# Patient Record
Sex: Female | Born: 1990 | Race: White | Hispanic: No | Marital: Single | Smoking: Never smoker
Health system: Southern US, Community
[De-identification: ages and names within clinical notes are randomized; demographics above are authoritative.]

## PROBLEM LIST (undated history)

## (undated) HISTORY — PX: BUNIONECTOMY: SHX129

---
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
## 2013-03-13 ENCOUNTER — Ambulatory Visit: Payer: Self-pay | Admitting: Family Medicine

## 2014-09-09 IMAGING — CR DG CHEST 2V
1 series · 3 of 3 positions shown · non-contrast
Comparison: none

REASON FOR EXAM: cough
COMMENTS:

PROCEDURE:     KDR - KDXR CHEST PA (OR AP) AND LAT  - March 13, 2013  [DATE]
RESULT:     The lungs are clear. The heart and pulmonary vessels are normal.
The bony and mediastinal structures are unremarkable. There is no effusion.
There is no pneumothorax or evidence of congestive failure.

[Series 1: pa · 0.17mm/px · 3 of 3 slices shown]
[im 1/3]
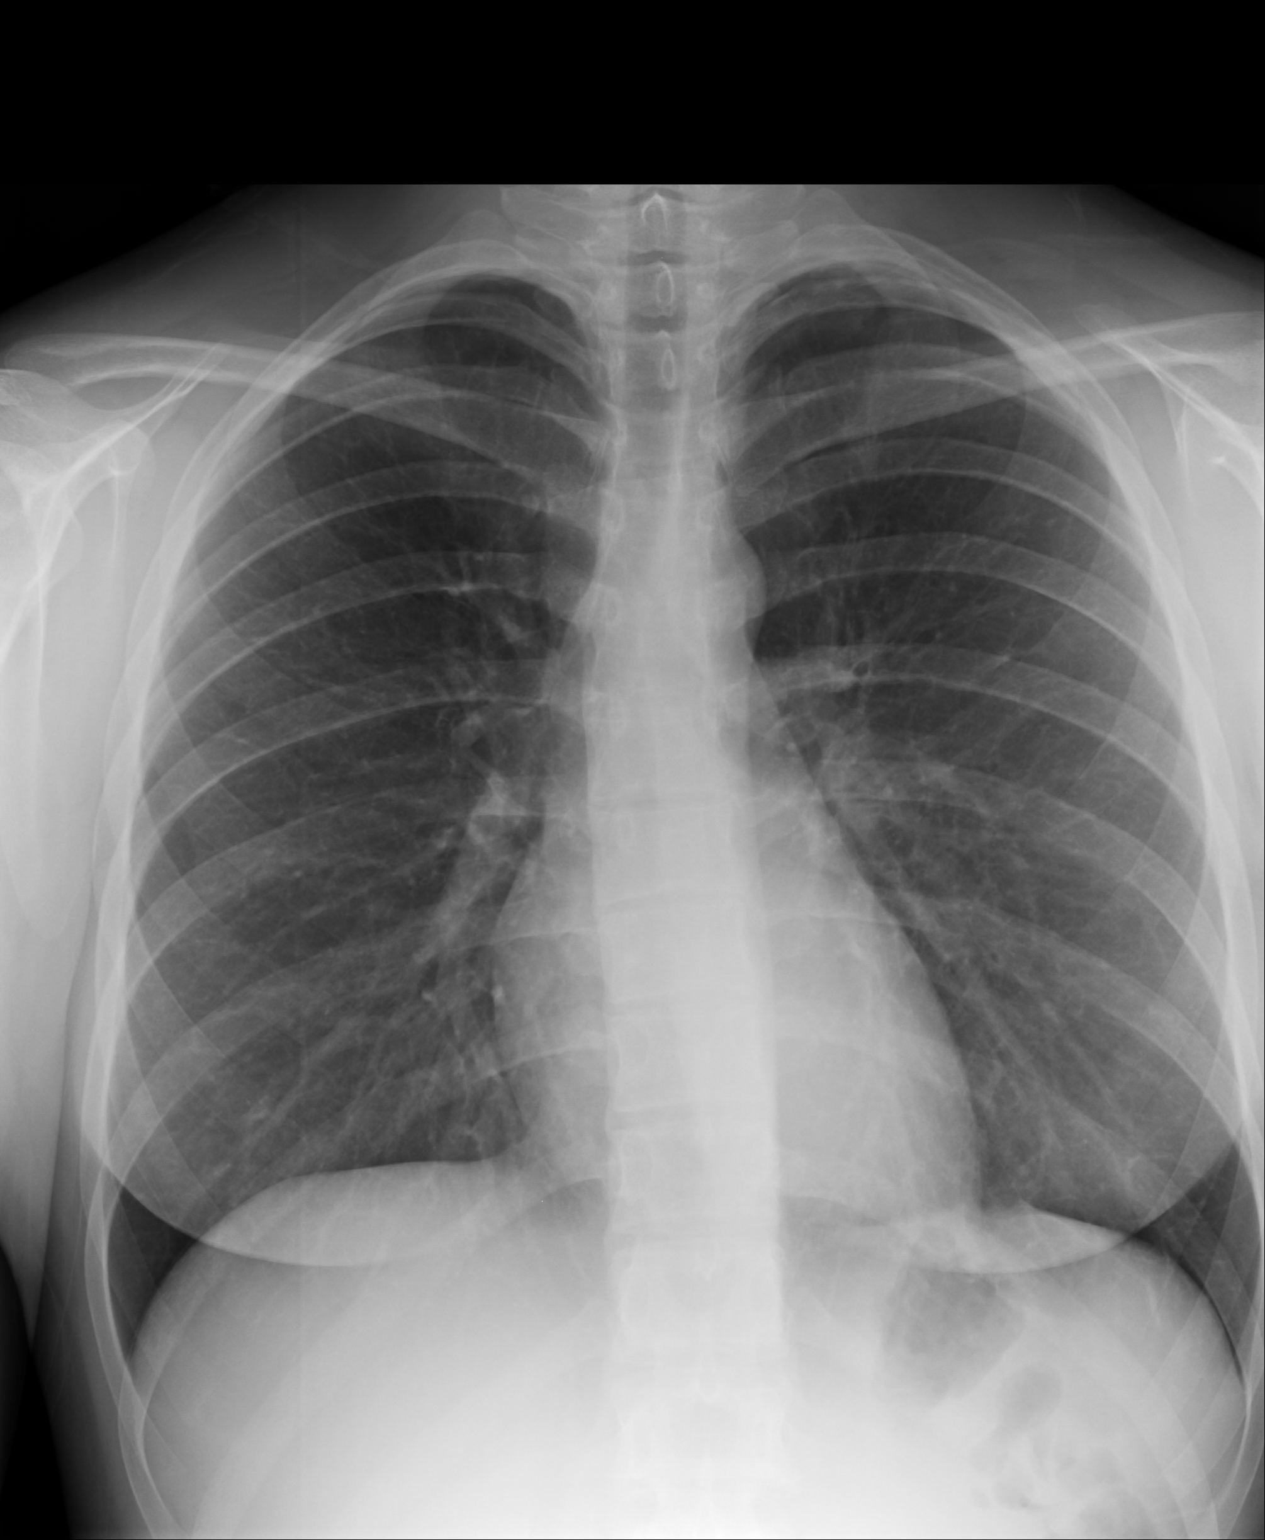
[im 2/3]
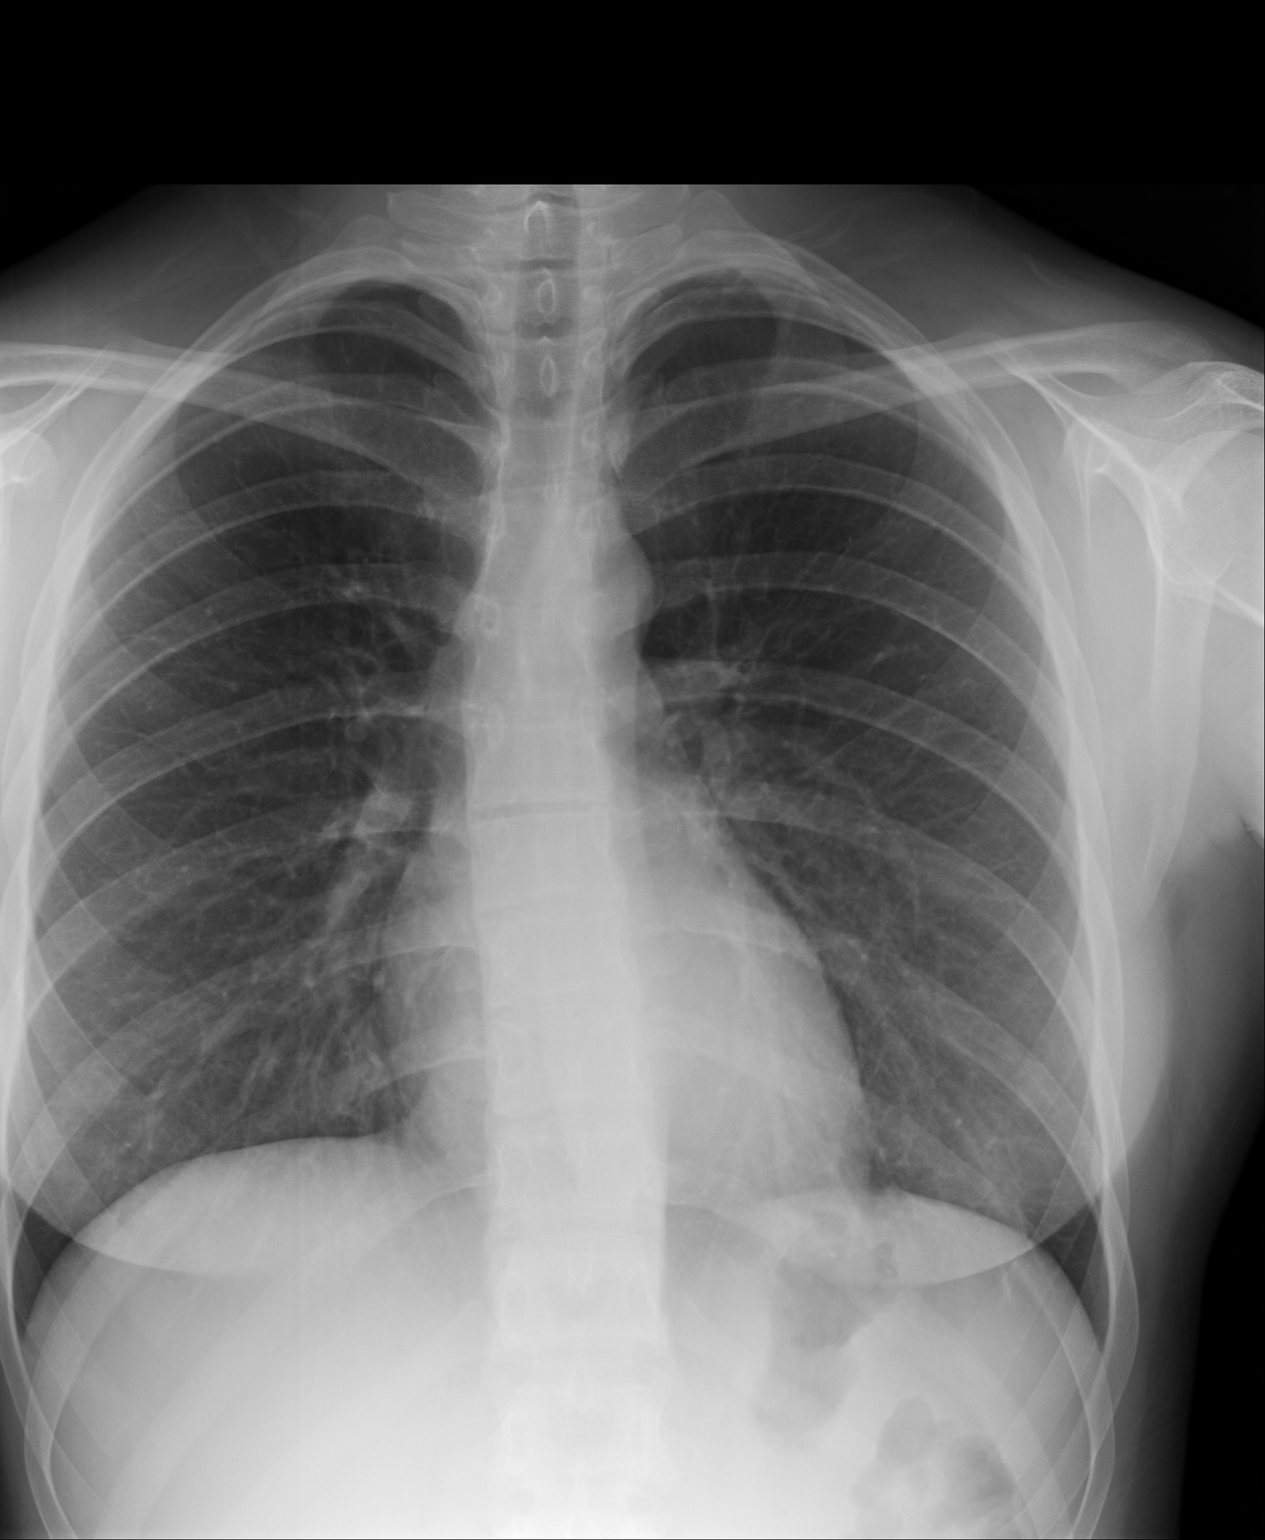
[im 3/3]
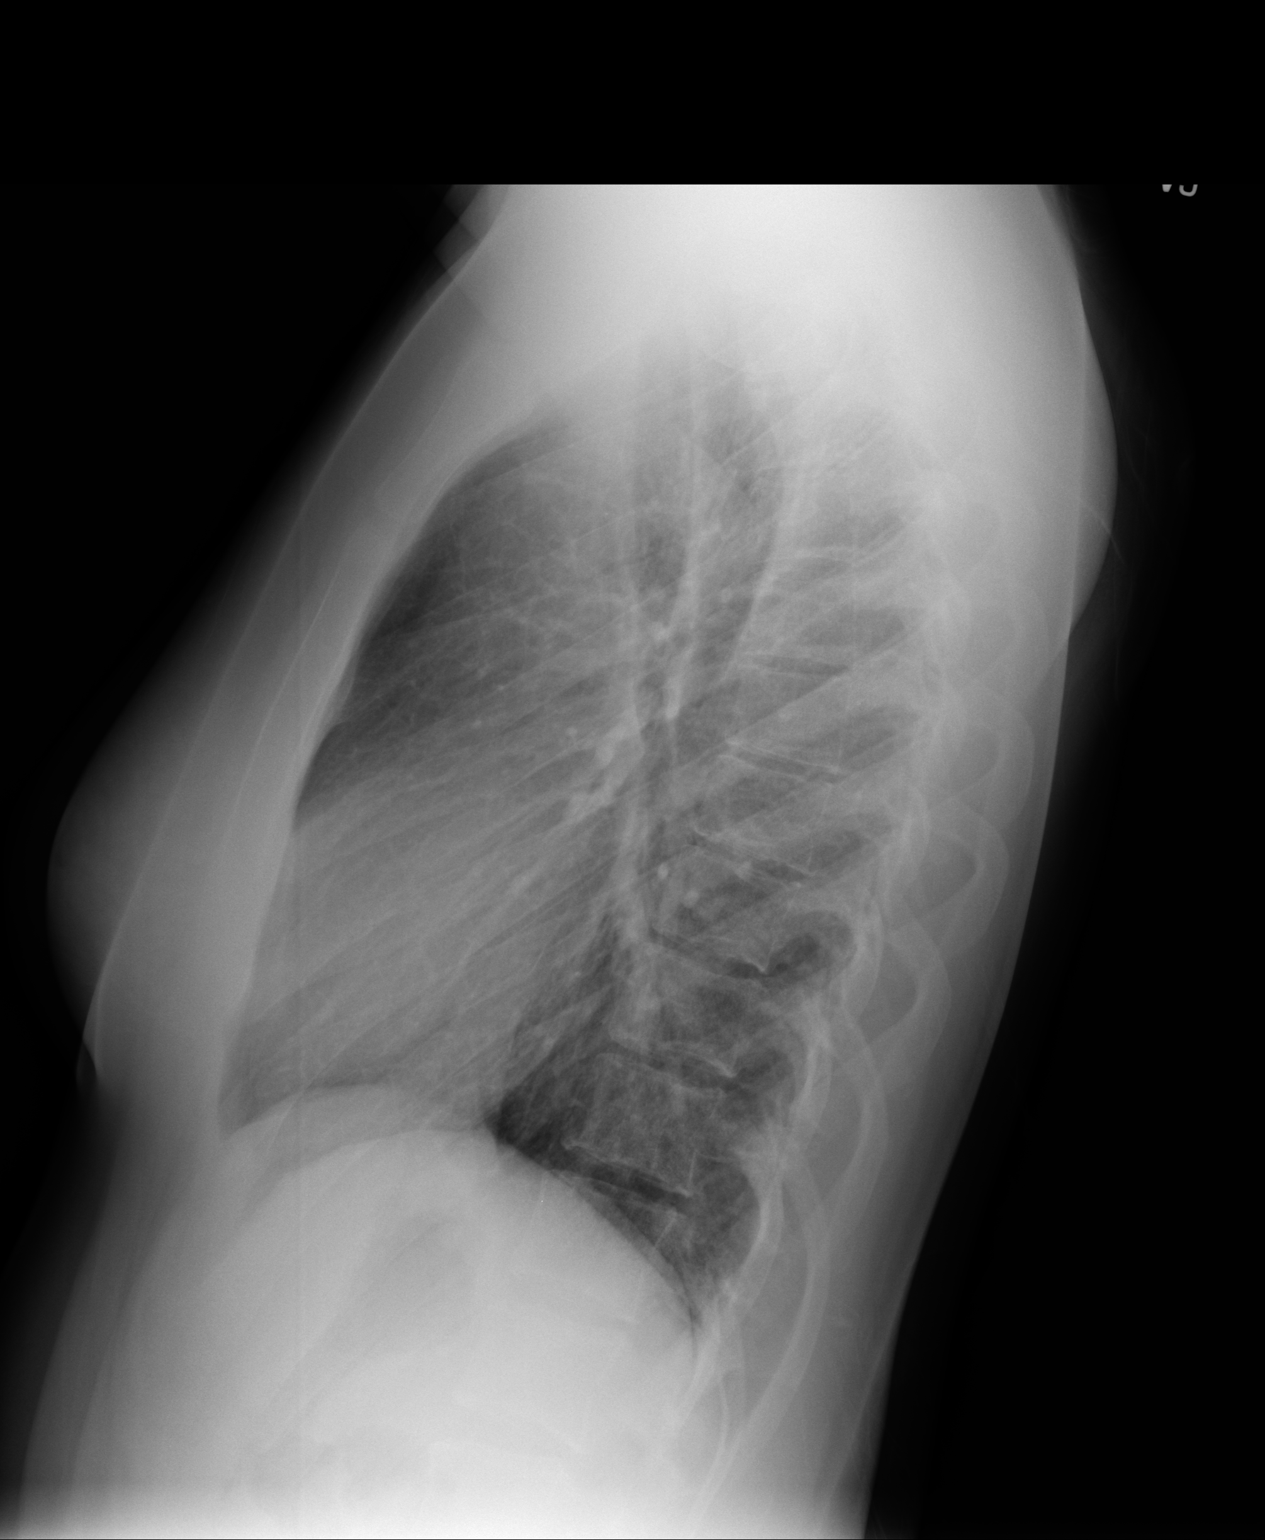

[3 of 3 positions shown; findings below may reference images not displayed]

IMPRESSION: No acute cardiopulmonary disease.

[REDACTED]

## 2016-02-04 ENCOUNTER — Other Ambulatory Visit: Payer: Self-pay

## 2016-02-04 ENCOUNTER — Encounter: Payer: Self-pay | Admitting: Physician Assistant

## 2016-02-04 ENCOUNTER — Ambulatory Visit (INDEPENDENT_AMBULATORY_CARE_PROVIDER_SITE_OTHER): Payer: PRIVATE HEALTH INSURANCE | Admitting: Physician Assistant

## 2016-02-04 VITALS — BP 118/72 | HR 70 | Temp 97.6°F | Resp 16 | Ht 68.0 in | Wt 181.8 lb

## 2016-02-04 DIAGNOSIS — J309 Allergic rhinitis, unspecified: Secondary | ICD-10-CM | POA: Insufficient documentation

## 2016-02-04 DIAGNOSIS — Z7251 High risk heterosexual behavior: Secondary | ICD-10-CM | POA: Insufficient documentation

## 2016-02-04 DIAGNOSIS — Z23 Encounter for immunization: Secondary | ICD-10-CM

## 2016-02-04 DIAGNOSIS — Z0000 Encounter for general adult medical examination without abnormal findings: Secondary | ICD-10-CM

## 2016-02-04 DIAGNOSIS — B351 Tinea unguium: Secondary | ICD-10-CM | POA: Insufficient documentation

## 2016-02-04 DIAGNOSIS — A5609 Other chlamydial infection of lower genitourinary tract: Secondary | ICD-10-CM | POA: Insufficient documentation

## 2016-02-04 DIAGNOSIS — R87618 Other abnormal cytological findings on specimens from cervix uteri: Secondary | ICD-10-CM | POA: Insufficient documentation

## 2016-02-04 DIAGNOSIS — IMO0002 Reserved for concepts with insufficient information to code with codable children: Secondary | ICD-10-CM | POA: Insufficient documentation

## 2016-02-04 NOTE — Patient Instructions (Signed)
Health Maintenance, Female Adopting a healthy lifestyle and getting preventive care can go a long way to promote health and wellness. Talk with your health care provider about what schedule of regular examinations is right for you. This is a good chance for you to check in with your provider about disease prevention and staying healthy. In between checkups, there are plenty of things you can do on your own. Experts have done a lot of research about which lifestyle changes and preventive measures are most likely to keep you healthy. Ask your health care provider for more information. WEIGHT AND DIET  Eat a healthy diet  Be sure to include plenty of vegetables, fruits, low-fat dairy products, and lean protein.  Do not eat a lot of foods high in solid fats, added sugars, or salt.  Get regular exercise. This is one of the most important things you can do for your health.  Most adults should exercise for at least 150 minutes each week. The exercise should increase your heart rate and make you sweat (moderate-intensity exercise).  Most adults should also do strengthening exercises at least twice a week. This is in addition to the moderate-intensity exercise.  Maintain a healthy weight  Body mass index (BMI) is a measurement that can be used to identify possible weight problems. It estimates body fat based on height and weight. Your health care provider can help determine your BMI and help you achieve or maintain a healthy weight.  For females 20 years of age and older:   A BMI below 18.5 is considered underweight.  A BMI of 18.5 to 24.9 is normal.  A BMI of 25 to 29.9 is considered overweight.  A BMI of 30 and above is considered obese.  Watch levels of cholesterol and blood lipids  You should start having your blood tested for lipids and cholesterol at 24 years of age, then have this test every 5 years.  You may need to have your cholesterol levels checked more often if:  Your lipid  or cholesterol levels are high.  You are older than 24 years of age.  You are at high risk for heart disease.  CANCER SCREENING   Lung Cancer  Lung cancer screening is recommended for adults 55-80 years old who are at high risk for lung cancer because of a history of smoking.  A yearly low-dose CT scan of the lungs is recommended for people who:  Currently smoke.  Have quit within the past 15 years.  Have at least a 30-pack-year history of smoking. A pack year is smoking an average of one pack of cigarettes a day for 1 year.  Yearly screening should continue until it has been 15 years since you quit.  Yearly screening should stop if you develop a health problem that would prevent you from having lung cancer treatment.  Breast Cancer  Practice breast self-awareness. This means understanding how your breasts normally appear and feel.  It also means doing regular breast self-exams. Let your health care provider know about any changes, no matter how small.  If you are in your 20s or 30s, you should have a clinical breast exam (CBE) by a health care provider every 1-3 years as part of a regular health exam.  If you are 40 or older, have a CBE every year. Also consider having a breast X-ray (mammogram) every year.  If you have a family history of breast cancer, talk to your health care provider about genetic screening.  If you   are at high risk for breast cancer, talk to your health care provider about having an MRI and a mammogram every year.  Breast cancer gene (BRCA) assessment is recommended for women who have family members with BRCA-related cancers. BRCA-related cancers include:  Breast.  Ovarian.  Tubal.  Peritoneal cancers.  Results of the assessment will determine the need for genetic counseling and BRCA1 and BRCA2 testing. Cervical Cancer Your health care provider may recommend that you be screened regularly for cancer of the pelvic organs (ovaries, uterus, and  vagina). This screening involves a pelvic examination, including checking for microscopic changes to the surface of your cervix (Pap test). You may be encouraged to have this screening done every 3 years, beginning at age 21.  For women ages 30-65, health care providers may recommend pelvic exams and Pap testing every 3 years, or they may recommend the Pap and pelvic exam, combined with testing for human papilloma virus (HPV), every 5 years. Some types of HPV increase your risk of cervical cancer. Testing for HPV may also be done on women of any age with unclear Pap test results.  Other health care providers may not recommend any screening for nonpregnant women who are considered low risk for pelvic cancer and who do not have symptoms. Ask your health care provider if a screening pelvic exam is right for you.  If you have had past treatment for cervical cancer or a condition that could lead to cancer, you need Pap tests and screening for cancer for at least 20 years after your treatment. If Pap tests have been discontinued, your risk factors (such as having a new sexual partner) need to be reassessed to determine if screening should resume. Some women have medical problems that increase the chance of getting cervical cancer. In these cases, your health care provider may recommend more frequent screening and Pap tests. Colorectal Cancer  This type of cancer can be detected and often prevented.  Routine colorectal cancer screening usually begins at 24 years of age and continues through 24 years of age.  Your health care provider may recommend screening at an earlier age if you have risk factors for colon cancer.  Your health care provider may also recommend using home test kits to check for hidden blood in the stool.  A small camera at the end of a tube can be used to examine your colon directly (sigmoidoscopy or colonoscopy). This is done to check for the earliest forms of colorectal  cancer.  Routine screening usually begins at age 50.  Direct examination of the colon should be repeated every 5-10 years through 24 years of age. However, you may need to be screened more often if early forms of precancerous polyps or small growths are found. Skin Cancer  Check your skin from head to toe regularly.  Tell your health care provider about any new moles or changes in moles, especially if there is a change in a mole's shape or color.  Also tell your health care provider if you have a mole that is larger than the size of a pencil eraser.  Always use sunscreen. Apply sunscreen liberally and repeatedly throughout the day.  Protect yourself by wearing long sleeves, pants, a wide-brimmed hat, and sunglasses whenever you are outside. HEART DISEASE, DIABETES, AND HIGH BLOOD PRESSURE   High blood pressure causes heart disease and increases the risk of stroke. High blood pressure is more likely to develop in:  People who have blood pressure in the high end   of the normal range (130-139/85-89 mm Hg).  People who are overweight or obese.  People who are African American.  If you are 38-23 years of age, have your blood pressure checked every 3-5 years. If you are 61 years of age or older, have your blood pressure checked every year. You should have your blood pressure measured twice--once when you are at a hospital or clinic, and once when you are not at a hospital or clinic. Record the average of the two measurements. To check your blood pressure when you are not at a hospital or clinic, you can use:  An automated blood pressure machine at a pharmacy.  A home blood pressure monitor.  If you are between 45 years and 39 years old, ask your health care provider if you should take aspirin to prevent strokes.  Have regular diabetes screenings. This involves taking a blood sample to check your fasting blood sugar level.  If you are at a normal weight and have a low risk for diabetes,  have this test once every three years after 24 years of age.  If you are overweight and have a high risk for diabetes, consider being tested at a younger age or more often. PREVENTING INFECTION  Hepatitis B  If you have a higher risk for hepatitis B, you should be screened for this virus. You are considered at high risk for hepatitis B if:  You were born in a country where hepatitis B is common. Ask your health care provider which countries are considered high risk.  Your parents were born in a high-risk country, and you have not been immunized against hepatitis B (hepatitis B vaccine).  You have HIV or AIDS.  You use needles to inject street drugs.  You live with someone who has hepatitis B.  You have had sex with someone who has hepatitis B.  You get hemodialysis treatment.  You take certain medicines for conditions, including cancer, organ transplantation, and autoimmune conditions. Hepatitis C  Blood testing is recommended for:  Everyone born from [birth year] through 1964.  Anyone with known risk factors for hepatitis C. Sexually transmitted infections (STIs)  You should be screened for sexually transmitted infections (STIs) including gonorrhea and chlamydia if:  You are sexually active and are younger than 24 years of age.  You are older than 24 years of age and your health care provider tells you that you are at risk for this type of infection.  Your sexual activity has changed since you were last screened and you are at an increased risk for chlamydia or gonorrhea. Ask your health care provider if you are at risk.  If you do not have HIV, but are at risk, it may be recommended that you take a prescription medicine daily to prevent HIV infection. This is called pre-exposure prophylaxis (PrEP). You are considered at risk if:  You are sexually active and do not regularly use condoms or know the HIV status of your partner(s).  You take drugs by injection.  You are sexually  active with a partner who has HIV. Talk with your health care provider about whether you are at high risk of being infected with HIV. If you choose to begin PrEP, you should first be tested for HIV. You should then be tested every 3 months for as long as you are taking PrEP.  PREGNANCY   If you are premenopausal and you may become pregnant, ask your health care provider about preconception counseling.  If you may  become pregnant, take 400 to 800 micrograms (mcg) of folic acid every day.  If you want to prevent pregnancy, talk to your health care provider about birth control (contraception). OSTEOPOROSIS AND MENOPAUSE   Osteoporosis is a disease in which the bones lose minerals and strength with aging. This can result in serious bone fractures. Your risk for osteoporosis can be identified using a bone density scan.  If you are 61 years of age or older, or if you are at risk for osteoporosis and fractures, ask your health care provider if you should be screened.  Ask your health care provider whether you should take a calcium or vitamin D supplement to lower your risk for osteoporosis.  Menopause may have certain physical symptoms and risks.  Hormone replacement therapy may reduce some of these symptoms and risks. Talk to your health care provider about whether hormone replacement therapy is right for you.  HOME CARE INSTRUCTIONS   Schedule regular health, dental, and eye exams.  Stay current with your immunizations.   Do not use any tobacco products including cigarettes, chewing tobacco, or electronic cigarettes.  If you are pregnant, do not drink alcohol.  If you are breastfeeding, limit how much and how often you drink alcohol.  Limit alcohol intake to no more than 1 drink per day for nonpregnant women. One drink equals 12 ounces of beer, 5 ounces of wine, or 1 ounces of hard liquor.  Do not use street drugs.  Do not share needles.  Ask your health care provider for help if  you need support or information about quitting drugs.  Tell your health care provider if you often feel depressed.  Tell your health care provider if you have ever been abused or do not feel safe at home.   This information is not intended to replace advice given to you by your health care provider. Make sure you discuss any questions you have with your health care provider.   Document Released: 04/12/2011 Document Revised: 10/18/2014 Document Reviewed: 08/29/2013 Elsevier Interactive Patient Education [employer].

## 2016-02-04 NOTE — Progress Notes (Signed)
Patient: Tonya Copeland, Female    DOB: 23-Feb-1991, 25 y.o.   MRN: [id number] Visit Date: 02/04/2016  Today's Provider: Margaretann Loveless, PA-C   Chief Complaint  Patient presents with  . Annual Exam    Needs form to be fill out for work   Subjective:    Annual physical exam Tonya Copeland is a 25 y.o. female who presents today for health maintenance and physical. She feels well. . She reports she is sleeping well. 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 She has no complaints today.  She did have an abnormal (ACSUS with positive high-risk HPV) in 2015. She was referred to [facility] and has been followed by them for repeat paps since.   -----------------------------------------------------------------   Review of Systems  Constitutional: Negative.   HENT: Negative.   Eyes: Negative.   Respiratory: Negative.   Cardiovascular: Negative.   Gastrointestinal: Negative.   Endocrine: Negative.   Genitourinary: Negative.   Musculoskeletal: Negative.   Skin: Negative.   Allergic/Immunologic: Negative.   Hematological: Negative.   Psychiatric/Behavioral: Negative.     Social History      She  reports that she has never smoked. She does not have any smokeless tobacco history on file. She reports that she drinks alcohol. She reports that she does not use illicit drugs.       Social History   Social History  . Marital Status: Single    Spouse Name: N/A  . Number of Children: N/A  . Years of Education: N/A   Social History Main Topics  . Smoking status: Never Smoker   . Smokeless tobacco: None  . Alcohol Use: Yes     Comment: Occasional alcohol use. Drinks 3-4 beers on the weekend.  . Drug Use: No  . Sexual Activity: Not Asked   Other Topics Concern  . None   Social History Narrative  . None    History reviewed. No pertinent past medical history.   Patient Active Problem List   Diagnosis Date Noted  . 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 Allergic rhinitis 02/04/2016  . Chlamydial cervicitis 02/04/2016  . High risk sexual behavior 02/04/2016  . Dermatophytosis of nail 02/04/2016    History reviewed. No pertinent past surgical history.  Family History        Family Status  Relation Status Death Age  . Mother Alive   . Sister Alive   . Maternal Grandmother Alive   . Maternal Grandfather Alive   . Paternal Grandmother Alive   . Paternal Grandfather Deceased     Died from Lung cancer        Her family history includes Diabetes in her maternal grandfather.    No Known Allergies  Previous Medications   LEVONORGESTREL-ETHINYL ESTRADIOL (AVIANE) 0.1-20 MG-MCG TABLET    Take by mouth.   LORATADINE (CLARITIN) 10 MG TABLET    Take by mouth.   MOMETASONE (NASONEX) 50 MCG/ACT NASAL SPRAY    Place into the nose.   MONTELUKAST (SINGULAIR) 10 MG TABLET    Take by mouth.    Patient Care Team: Margaretann Loveless, PA-C as PCP - General (Family Medicine)     Objective:   Vitals: BP 118/72 mmHg  Pulse 70  Temp(Src) 97.6 F (36.4 C) (Oral)  Resp 16  Ht  (1.727 m)  Wt 181 lb 12.8 oz (82.464 kg)  BMI 27.65 kg/m2  LMP 01/28/2016   Physical Exam  Constitutional: She is oriented to person, place, and time. She appears well-developed and well-nourished. No distress.  HENT:  Head: Normocephalic and atraumatic.  Right Ear: Hearing, tympanic membrane, external ear and ear canal normal.  Left Ear: Hearing, tympanic membrane, external ear and ear canal normal.  Nose: Nose normal. Right sinus exhibits no maxillary sinus tenderness and no frontal sinus tenderness. Left sinus exhibits no maxillary sinus tenderness and no frontal sinus tenderness.  Mouth/Throat: Uvula is midline, oropharynx is clear and moist and mucous membranes are normal. No oropharyngeal exudate, posterior oropharyngeal edema or posterior oropharyngeal erythema.  Eyes: Conjunctivae and EOM are normal.  Pupils are equal, round, and reactive to light. Right eye exhibits no discharge. Left eye exhibits no discharge. No scleral icterus.  Neck: Normal range of motion. Neck supple. No JVD present. No tracheal deviation present. No thyromegaly present.  Cardiovascular: Normal rate, regular rhythm, normal heart sounds and intact distal pulses.  Exam reveals no gallop and no friction rub.   No murmur heard. Pulmonary/Chest: Effort normal and breath sounds normal. No respiratory distress. She has no wheezes. She has no rales. She exhibits no tenderness.  Abdominal: Soft. Bowel sounds are normal. She exhibits no distension and no mass. There is no tenderness. There is no rebound and no guarding.  Musculoskeletal: Normal range of motion. She exhibits no edema or tenderness.  Lymphadenopathy:    She has no cervical adenopathy.  Neurological: She is alert and oriented to person, place, and time.  Skin: Skin is warm and dry. No rash noted. She is not diaphoretic.  Psychiatric: She has a normal mood and affect. Her behavior is normal. Judgment and thought content normal.  Vitals reviewed.    Depression Screen No flowsheet data found.    Assessment & Plan:     Routine Health Maintenance and Physical Exam  1. Annual physical exam Physical exam was completely normal today. No labs per patient request. Pre-employment physical form was filled out and given to patient. Call if any acute issue, question or concern.  2. Need for Tdap vaccination Tdap vaccine given today without complication. - Tdap vaccine greater than or equal to 7yo IM   Exercise Activities and Dietary recommendations Goals    None      Immunization History  Administered Date(s) Administered  . Tdap 02/04/2016    Health Maintenance  Topic Date Due  . HIV Screening  10/20/2006  . TETANUS/TDAP  10/20/2010  . PAP SMEAR  10/20/2012  . INFLUENZA VACCINE  05/11/2016      Discussed health benefits of physical activity, and  encouraged her to engage in regular exercise appropriate for her age and condition.    --------------------------------------------------------------------

## 2020-07-28 ENCOUNTER — Other Ambulatory Visit (HOSPITAL_COMMUNITY)
Admission: RE | Admit: 2020-07-28 | Discharge: 2020-07-28 | Disposition: A | Discharge status: Home / Self Care | Payer: Self-pay | Source: Ambulatory Visit | Attending: Physician Assistant | Admitting: Physician Assistant

## 2020-07-28 ENCOUNTER — Other Ambulatory Visit: Payer: Self-pay

## 2020-07-28 ENCOUNTER — Ambulatory Visit (INDEPENDENT_AMBULATORY_CARE_PROVIDER_SITE_OTHER): Payer: Self-pay | Admitting: Physician Assistant

## 2020-07-28 ENCOUNTER — Encounter: Payer: Self-pay | Admitting: Physician Assistant

## 2020-07-28 VITALS — BP 124/77 | HR 67 | Temp 98.2°F | Resp 16 | Ht 68.0 in | Wt 179.2 lb

## 2020-07-28 DIAGNOSIS — F320 Major depressive disorder, single episode, mild: Secondary | ICD-10-CM

## 2020-07-28 DIAGNOSIS — N760 Acute vaginitis: Secondary | ICD-10-CM

## 2020-07-28 DIAGNOSIS — Z8742 Personal history of other diseases of the female genital tract: Secondary | ICD-10-CM | POA: Diagnosis not present

## 2020-07-28 DIAGNOSIS — Z7251 High risk heterosexual behavior: Secondary | ICD-10-CM

## 2020-07-28 DIAGNOSIS — Z114 Encounter for screening for human immunodeficiency virus [HIV]: Secondary | ICD-10-CM

## 2020-07-28 DIAGNOSIS — Z30019 Encounter for initial prescription of contraceptives, unspecified: Secondary | ICD-10-CM

## 2020-07-28 DIAGNOSIS — R87618 Other abnormal cytological findings on specimens from cervix uteri: Secondary | ICD-10-CM

## 2020-07-28 DIAGNOSIS — Z1159 Encounter for screening for other viral diseases: Secondary | ICD-10-CM

## 2020-07-28 DIAGNOSIS — B9689 Other specified bacterial agents as the cause of diseases classified elsewhere: Secondary | ICD-10-CM

## 2020-07-28 DIAGNOSIS — Z0000 Encounter for general adult medical examination without abnormal findings: Secondary | ICD-10-CM

## 2020-07-28 LAB — POCT URINE PREGNANCY: Preg Test, Ur: NEGATIVE

## 2020-07-28 MED ORDER — LEVONORGESTREL-ETHINYL ESTRAD 0.1-20 MG-MCG PO TABS: 1.0000 | ORAL_TABLET | Freq: Every day | ORAL | 4 refills | Status: DC

## 2020-07-28 MED ORDER — CITALOPRAM HYDROBROMIDE 20 MG PO TABS: ORAL_TABLET | ORAL | 3 refills | Status: DC

## 2020-07-28 NOTE — Patient Instructions (Signed)
Citalopram tablets What is this medicine? CITALOPRAM (sye TAL oh pram) is a medicine for depression. This medicine may be used for other purposes; ask your health care provider or pharmacist if you have questions. COMMON BRAND NAME(S): Celexa What should I tell my health care provider before I take this medicine? They need to know if you have any of these conditions:  bleeding disorders  bipolar disorder or a family history of bipolar disorder  glaucoma  heart disease  history of irregular heartbeat  kidney disease  liver disease  low levels of magnesium or potassium in the blood  receiving electroconvulsive therapy  seizures  suicidal thoughts, plans, or attempt; a previous suicide attempt by you or a family member  take medicines that treat or prevent blood clots  thyroid disease  an unusual or allergic reaction to citalopram, escitalopram, other medicines, foods, dyes, or preservatives  pregnant or trying to become pregnant  breast-feeding How should I use this medicine? Take this medicine by mouth with a glass of water. Follow the directions on the prescription label. You can take it with or without food. Take your medicine at regular intervals. Do not take your medicine more often than directed. Do not stop taking this medicine suddenly except upon the advice of your doctor. Stopping this medicine too quickly may cause serious side effects or your condition may worsen. A special MedGuide will be given to you by the pharmacist with each prescription and refill. Be sure to read this information carefully each time. Talk to your pediatrician regarding the use of this medicine in children. Special care may be needed. Patients over 41 years old may have a stronger reaction and need a smaller dose. Overdosage: If you think you have taken too much of this medicine contact a poison control center or emergency room at once. NOTE: This medicine is only for you. Do not share  this medicine with others. What if I miss a dose? If you miss a dose, take it as soon as you can. If it is almost time for your next dose, take only that dose. Do not take double or extra doses. What may interact with this medicine? Do not take this medicine with any of the following medications:  certain medicines for fungal infections like fluconazole, itraconazole, ketoconazole, posaconazole, voriconazole  cisapride  dronedarone  escitalopram  linezolid  MAOIs like Carbex, Eldepryl, Marplan, Nardil, and Parnate  methylene blue (injected into a vein)  pimozide  thioridazine This medicine may also interact with the following medications:  alcohol  amphetamines  aspirin and aspirin-like medicines  carbamazepine  certain medicines for depression, anxiety, or psychotic disturbances  certain medicines for infections like chloroquine, clarithromycin, erythromycin, furazolidone, isoniazid, pentamidine  certain medicines for migraine headaches like almotriptan, eletriptan, frovatriptan, naratriptan, rizatriptan, sumatriptan, zolmitriptan  certain medicines for sleep  certain medicines that treat or prevent blood clots like dalteparin, enoxaparin, warfarin  cimetidine  diuretics  dofetilide  fentanyl  lithium  methadone  metoprolol  NSAIDs, medicines for pain and inflammation, like ibuprofen or naproxen  omeprazole  other medicines that prolong the QT interval (cause an abnormal heart rhythm)  procarbazine  rasagiline  supplements like St. John's wort, kava kava, valerian  tramadol  tryptophan  ziprasidone This list may not describe all possible interactions. Give your health care provider a list of all the medicines, herbs, non-prescription drugs, or dietary supplements you use. Also tell them if you smoke, drink alcohol, or use illegal drugs. Some items may interact with  your medicine. What should I watch for while using this medicine? Tell your  doctor if your symptoms do not get better or if they get worse. Visit your doctor or health care professional for regular checks on your progress. Because it may take several weeks to see the full effects of this medicine, it is important to continue your treatment as prescribed by your doctor. Patients and their families should watch out for new or worsening thoughts of suicide or depression. Also watch out for sudden changes in feelings such as feeling anxious, agitated, panicky, irritable, hostile, aggressive, impulsive, severely restless, overly excited and hyperactive, or not being able to sleep. If this happens, especially at the beginning of treatment or after a change in dose, call your health care professional. Dennis Bast may get drowsy or dizzy. Do not drive, use machinery, or do anything that needs mental alertness until you know how this medicine affects you. Do not stand or sit up quickly, especially if you are an older patient. This reduces the risk of dizzy or fainting spells. Alcohol may interfere with the effect of this medicine. Avoid alcoholic drinks. Your mouth may get dry. Chewing sugarless gum or sucking hard candy, and drinking plenty of water will help. Contact your doctor if the problem does not go away or is severe. What side effects may I notice from receiving this medicine? Side effects that you should report to your doctor or health care professional as soon as possible:  allergic reactions like skin rash, itching or hives, swelling of the face, lips, or tongue  anxious  black, tarry stools  breathing problems  changes in vision  chest pain  confusion  elevated mood, decreased need for sleep, racing thoughts, impulsive behavior  eye pain  fast, irregular heartbeat  feeling faint or lightheaded, falls  feeling agitated, angry, or irritable  hallucination, loss of contact with reality  loss of balance or coordination  loss of memory  painful or prolonged  erections  restlessness, pacing, inability to keep still  seizures  stiff muscles  suicidal thoughts or other mood changes  trouble sleeping  unusual bleeding or bruising  unusually weak or tired  vomiting Side effects that usually do not require medical attention (report to your doctor or health care professional if they continue or are bothersome):  change in appetite or weight  change in sex drive or performance  dizziness  headache  increased sweating  indigestion, nausea  tremors This list may not describe all possible side effects. Call your doctor for medical advice about side effects. You may report side effects to FDA at 1-800-FDA-1088. Where should I keep my medicine? Keep out of reach of children. Store at room temperature between 15 and 30 degrees C (59 and 86 degrees F). Throw away any unused medicine after the expiration date. NOTE: This sheet is a summary. It may not cover all possible information. If you have questions about this medicine, talk to your doctor, pharmacist, or health care provider.  2020 Elsevier/Gold Standard (2018-09-18 09:05:36)   Preventive Care 33-61 Years Old, Female Preventive care refers to visits with your health care provider and lifestyle choices that can promote health and wellness. This includes:  A yearly physical exam. This may also be called an annual well check.  Regular dental visits and eye exams.  Immunizations.  Screening for certain conditions.  Healthy lifestyle choices, such as eating a healthy diet, getting regular exercise, not using drugs or products that contain nicotine and tobacco, and  limiting alcohol use. What can I expect for my preventive care visit? Physical exam Your health care provider will check your:  Height and weight. This may be used to calculate body mass index (BMI), which tells if you are at a healthy weight.  Heart rate and blood pressure.  Skin for abnormal  spots. Counseling Your health care provider may ask you questions about your:  Alcohol, tobacco, and drug use.  Emotional well-being.  Home and relationship well-being.  Sexual activity.  Eating habits.  Work and work Astronomer.  Method of birth control.  Menstrual cycle.  Pregnancy history. What immunizations do I need?  Influenza (flu) vaccine  This is recommended every year. Tetanus, diphtheria, and pertussis (Tdap) vaccine  You may need a Td booster every 10 years. Varicella (chickenpox) vaccine  You may need this if you have not been vaccinated. Human papillomavirus (HPV) vaccine  If recommended by your health care provider, you may need three doses over 6 months. Measles, mumps, and rubella (MMR) vaccine  You may need at least one dose of MMR. You may also need a second dose. Meningococcal conjugate (MenACWY) vaccine  One dose is recommended if you are age 19-21 years and a first-year college student living in a residence hall, or if you have one of several medical conditions. You may also need additional booster doses. Pneumococcal conjugate (PCV13) vaccine  You may need this if you have certain conditions and were not previously vaccinated. Pneumococcal polysaccharide (PPSV23) vaccine  You may need one or two doses if you smoke cigarettes or if you have certain conditions. Hepatitis A vaccine  You may need this if you have certain conditions or if you travel or work in places where you may be exposed to hepatitis A. Hepatitis B vaccine  You may need this if you have certain conditions or if you travel or work in places where you may be exposed to hepatitis B. Haemophilus influenzae type b (Hib) vaccine  You may need this if you have certain conditions. You may receive vaccines as individual doses or as more than one vaccine together in one shot (combination vaccines). Talk with your health care provider about the risks and benefits of combination  vaccines. What tests do I need?  Blood tests  Lipid and cholesterol levels. These may be checked every 5 years starting at age 25.  Hepatitis C test.  Hepatitis B test. Screening  Diabetes screening. This is done by checking your blood sugar (glucose) after you have not eaten for a while (fasting).  Sexually transmitted disease (STD) testing.  BRCA-related cancer screening. This may be done if you have a family history of breast, ovarian, tubal, or peritoneal cancers.  Pelvic exam and Pap test. This may be done every 3 years starting at age 3. Starting at age 41, this may be done every 5 years if you have a Pap test in combination with an HPV test. Talk with your health care provider about your test results, treatment options, and if necessary, the need for more tests. Follow these instructions at home: Eating and drinking   Eat a diet that includes fresh fruits and vegetables, whole grains, lean protein, and low-fat dairy.  Take vitamin and mineral supplements as recommended by your health care provider.  Do not drink alcohol if: ? Your health care provider tells you not to drink. ? You are pregnant, may be pregnant, or are planning to become pregnant.  If you drink alcohol: ? Limit how much you  have to 0-1 drink a day. ? Be aware of how much alcohol is in your drink. In the [place] one drink equals one 12 oz bottle of beer (355 mL), one 5 oz glass of wine (148 mL), or one 1 oz glass of hard liquor (44 mL). Lifestyle  Take daily care of your teeth and gums.  Stay active. Exercise for at least 30 minutes on 5 or more days each week.  Do not use any products that contain nicotine or tobacco, such as cigarettes, e-cigarettes, and chewing tobacco. If you need help quitting, ask your health care provider.  If you are sexually active, practice safe sex. Use a condom or other form of birth control (contraception) in order to prevent pregnancy and STIs (sexually transmitted  infections). If you plan to become pregnant, see your health care provider for a preconception visit. What's next?  Visit your health care provider once a year for a well check visit.  Ask your health care provider how often you should have your eyes and teeth checked.  Stay up to date on all vaccines. This information is not intended to replace advice given to you by your health care provider. Make sure you discuss any questions you have with your health care provider. Document Revised: 06/08/2018 Document Reviewed: 06/08/2018 Elsevier Patient Education  2020 [employer].

## 2020-07-28 NOTE — Progress Notes (Signed)
Complete physical exam   Patient: Tonya Copeland   DOB: Aug 30, 1991   29 y.o. Female  MRN: [id number] Visit Date: 07/28/2020  Today's healthcare provider: Mar Daring, PA-C   Chief Complaint  Patient presents with  . Annual Exam   Subjective    Tonya Copeland is a 29 y.o. female who presents today for a complete physical exam.  She reports consuming a general diet. Gym/ health club routine includes cardio. She generally feels well. She reports sleeping fairly well. She does have additional problems to discuss today. She would like to talk to provider about Depression. She has had this before but worsening.  HPI  Depression: Patient complains of depression. She complains of depressed mood, fatigue, feelings of worthlessness/guilt, hopelessness and suicidal thoughts without plan. Onset was approximately a few months ago, gradually worsening since that time.  She denies current suicidal and homicidal plan or intent. No previous treatment or counseling.     Need refills on BCP- she has not been taking birth control pills for a while-She is sexually active and using condoms.Would like to go back on the pill. LMP: 07/07/20  Patient Declined Influenza vaccine today.   History reviewed. No pertinent past medical history. History reviewed. No pertinent surgical history. Social History   Socioeconomic History  . Marital status: Single    Spouse name: Not on file  . Number of children: Not on file  . Years of education: Not on file  . Highest education level: Not on file  Occupational History  . Not on file  Tobacco Use  . Smoking status: Never Smoker  . Smokeless tobacco: Never Used  Substance and Sexual Activity  . Alcohol use: Yes    Comment: Occasional alcohol use. Drinks 3-4 beers on the weekend.  . Drug use: No  . Sexual activity: Not on file  Other Topics Concern  . Not on file  Social History Narrative  . Not on file   Social Determinants of Health    Financial Resource Strain:   . Difficulty of Paying Living Expenses: Not on file  Food Insecurity:   . Worried About Charity fundraiser in the Last Year: Not on file  . Ran Out of Food in the Last Year: Not on file  Transportation Needs:   . Lack of Transportation (Medical): Not on file  . Lack of Transportation (Non-Medical): Not on file  Physical Activity:   . Days of Exercise per Week: Not on file  . Minutes of Exercise per Session: Not on file  Stress:   . Feeling of Stress : Not on file  Social Connections:   . Frequency of Communication with Friends and Family: Not on file  . Frequency of Social Gatherings with Friends and Family: Not on file  . Attends Religious Services: Not on file  . Active Member of Clubs or Organizations: Not on file  . Attends Archivist Meetings: Not on file  . Marital Status: Not on file  Intimate Partner Violence:   . Fear of Current or Ex-Partner: Not on file  . Emotionally Abused: Not on file  . Physically Abused: Not on file  . Sexually Abused: Not on file   Family Status  Relation Name Status  . Mother  Alive  . Sister  Alive  . MGM  Alive  . MGF  Alive  . PGM  Alive  . PGF  Deceased       Died from Lung  cancer   Family History  Problem Relation Age of Onset  . Diabetes Maternal Grandfather    No Known Allergies  Patient Care Team: Rubye Beach as PCP - General (Family Medicine)   Medications: Outpatient Medications Prior to Visit  Medication Sig  . [DISCONTINUED] levonorgestrel-ethinyl estradiol (AVIANE) 0.1-20 MG-MCG tablet Take by mouth. (Patient not taking: Reported on 07/28/2020)  . [DISCONTINUED] loratadine (CLARITIN) 10 MG tablet Take by mouth. (Patient not taking: Reported on 07/28/2020)  . [DISCONTINUED] mometasone (NASONEX) 50 MCG/ACT nasal spray Place into the nose.  . [DISCONTINUED] montelukast (SINGULAIR) 10 MG tablet Take by mouth. (Patient not taking: Reported on 07/28/2020)   No  facility-administered medications prior to visit.    Review of Systems  Constitutional: Negative.   HENT: Negative.   Eyes: Negative.   Respiratory: Negative.   Cardiovascular: Negative.   Gastrointestinal: Negative.   Endocrine: Negative.   Genitourinary: Negative.   Musculoskeletal: Negative.   Skin: Negative.   Allergic/Immunologic: Negative.   Neurological: Negative.   Hematological: Negative.   Psychiatric/Behavioral: Negative.       Objective    BP 124/77 (BP Location: Left Arm, Patient Position: Sitting, Cuff Size: Large)   Pulse 67   Temp 98.2 F (36.8 C) (Oral)   Resp 16   Ht 5' 8"  (1.727 m)   Wt 179 lb 3.2 oz (81.3 kg)   LMP 07/07/2020   BMI 27.25 kg/m    Physical Exam Vitals reviewed.  Constitutional:      General: She is not in acute distress.    Appearance: Normal appearance. She is well-developed, well-groomed and overweight. She is not ill-appearing or diaphoretic.  HENT:     Head: Normocephalic and atraumatic.     Right Ear: Hearing, tympanic membrane, ear canal and external ear normal.     Left Ear: Hearing, tympanic membrane, ear canal and external ear normal.     Nose: Nose normal.     Mouth/Throat:     Mouth: Mucous membranes are moist.     Pharynx: Oropharynx is clear. Uvula midline. No oropharyngeal exudate.  Eyes:     General: No scleral icterus.       Right eye: No discharge.        Left eye: No discharge.     Extraocular Movements: Extraocular movements intact.     Conjunctiva/sclera: Conjunctivae normal.     Pupils: Pupils are equal, round, and reactive to light.  Neck:     Thyroid: No thyromegaly.     Vascular: No JVD.     Trachea: No tracheal deviation.  Cardiovascular:     Rate and Rhythm: Normal rate and regular rhythm.     Pulses: Normal pulses.     Heart sounds: Normal heart sounds. No murmur heard.  No friction rub. No gallop.   Pulmonary:     Effort: Pulmonary effort is normal. No respiratory distress.     Breath  sounds: Normal breath sounds. No wheezing or rales.  Chest:     Chest wall: No tenderness.     Breasts: Breasts are symmetrical.        Right: No inverted nipple, mass, nipple discharge, skin change or tenderness.        Left: No inverted nipple, mass, nipple discharge, skin change or tenderness.  Abdominal:     General: Abdomen is flat. Bowel sounds are normal. There is no distension.     Palpations: Abdomen is soft. There is no mass.     Tenderness: There  is no abdominal tenderness. There is no guarding or rebound.     Hernia: There is no hernia in the left inguinal area.  Genitourinary:    General: Normal vulva.     Exam position: Supine.     Labia:        Right: No rash, tenderness, lesion or injury.        Left: No rash, tenderness, lesion or injury.      Vagina: No signs of injury. Vaginal discharge (copious white discharge) present. No erythema, tenderness or bleeding.     Cervix: No cervical motion tenderness, discharge or friability.     Adnexa:        Right: No mass, tenderness or fullness.         Left: No mass, tenderness or fullness.       Rectum: Normal.  Musculoskeletal:        General: No tenderness. Normal range of motion.     Cervical back: Normal range of motion and neck supple. No tenderness.     Right lower leg: No edema.     Left lower leg: No edema.  Lymphadenopathy:     Cervical: No cervical adenopathy.  Skin:    General: Skin is warm and dry.     Capillary Refill: Capillary refill takes less than 2 seconds.     Findings: No rash.  Neurological:     General: No focal deficit present.     Mental Status: She is alert and oriented to person, place, and time. Mental status is at baseline.     Cranial Nerves: No cranial nerve deficit.     Coordination: Coordination normal.     Deep Tendon Reflexes: Reflexes are normal and symmetric.  Psychiatric:        Mood and Affect: Mood normal.        Behavior: Behavior normal. Behavior is cooperative.        Thought  Content: Thought content normal.        Judgment: Judgment normal.      Last depression screening scores PHQ 2/9 Scores 07/28/2020  PHQ - 2 Score 3  PHQ- 9 Score 13   Last fall risk screening Fall Risk  07/28/2020  Falls in the past year? 0  Number falls in past yr: 0  Injury with Fall? 0  Risk for fall due to : No Fall Risks  Follow up Falls evaluation completed   Last Audit-C alcohol use screening Alcohol Use Disorder Test (AUDIT) 07/28/2020  1. How often do you have a drink containing alcohol? 4  2. How many drinks containing alcohol do you have on a typical day when you are drinking? 1  3. How often do you have six or more drinks on one occasion? 3  AUDIT-C Score 8   A score of 3 or more in women, and 4 or more in men indicates increased risk for alcohol abuse, EXCEPT if all of the points are from question 1   No results found for any visits on 07/28/20.  Assessment & Plan    Routine Health Maintenance and Physical Exam  Exercise Activities and Dietary recommendations Goals   None     Immunization History  Administered Date(s) Administered  . DTaP 12/19/1991, 02/18/1992, 04/22/1992, 04/20/1993, 12/18/1996  . Hepatitis A 11/17/2006, 11/22/2007  . Hepatitis B 11/02/1993, 03/03/1994, 07/19/1994  . HiB (PRP-OMP) 12/19/1991, 02/18/1992, 10/20/1992  . IPV 12/19/1991, 02/18/1992, 04/20/1993, 12/18/1996  . MMR 01/20/1993, 12/18/1996  . Meningococcal Conjugate 11/17/2006  .  Td 07/30/2005  . Tdap 07/30/2005, 02/04/2016  . Varicella 03/03/1994, 07/30/2005    Health Maintenance  Topic Date Due  . PAP-Cervical Cytology Screening  Never done  . PAP SMEAR-Modifier  Never done  . INFLUENZA VACCINE  01/08/2021 (Originally 05/11/2020)  . Hepatitis C Screening  07/28/2021 (Originally 01-24-1991)  . TETANUS/TDAP  02/03/2026  . HIV Screening  Completed    Discussed health benefits of physical activity, and encouraged her to engage in regular exercise appropriate for her age  and condition.  1. Annual physical exam Normal physical exam today. Will check labs as below and f/u pending lab results. If labs are stable and WNL she will not need to have these rechecked for one year at her next annual physical exam. She is to call the office in the meantime if she has any acute issue, questions or concerns. - CBC w/Diff/Platelet - Comprehensive Metabolic Panel (CMET) - TSH - Lipid Panel With LDL/HDL Ratio  2. Screening for HIV without presence of risk factors Will defer at this time due to patient being self pay.  3. Encounter for hepatitis C screening test for low risk patient Will defer at this time due to patient being self pay.  4. Hx of abnormal cervical Pap smear Pap collected today. Will send as below and f/u pending results. - Cytology - PAP  5. Encounter for initial prescription of contraceptives, unspecified contraceptive Urine pregnancy negative. Will restart Aviane as below. Call if not tolerating or if having BTB or adverse effects.  - levonorgestrel-ethinyl estradiol (AVIANE) 0.1-20 MG-MCG tablet; Take 1 tablet by mouth daily.  Dispense: 84 tablet; Refill: 4  6. Pap smear abnormality of cervix/human papillomavirus (HPV) positive H/O this. Pap collected.   7. High risk heterosexual behavior H/O Chlamydia many years ago.   8. Depression, major, single episode, mild (HCC) Worsening. Will start citalopram as below. F/U in 4 weeks, virtual ok.  - citalopram (CELEXA) 20 MG tablet; Start with 1/2 tab (66m) PO daily x 1 week, then increase to 1 tab (258m PO daily  Dispense: 30 tablet; Refill: 3   Return in about 4 weeks (around 08/25/2020).     I,Reynolds BowlPA-C, have reviewed all documentation for this visit. The documentation on 07/28/20 for the exam, diagnosis, procedures, and orders are all accurate and complete.   JeRubye BeachBuJeff Davis [phone]) [phone])  [place]

## 2020-07-31 LAB — CYTOLOGY - PAP
Adequacy: ABSENT
Chlamydia: NEGATIVE
Comment: NEGATIVE
Comment: NEGATIVE
Comment: NEGATIVE
Comment: NORMAL
Diagnosis: NEGATIVE
High risk HPV: NEGATIVE
Neisseria Gonorrhea: NEGATIVE
Trichomonas: NEGATIVE

## 2020-08-01 MED ORDER — METRONIDAZOLE 500 MG PO TABS: 500.0000 mg | ORAL_TABLET | Freq: Two times a day (BID) | ORAL | 0 refills | Status: DC

## 2020-08-01 NOTE — Addendum Note (Signed)
Addended by: Margaretann Loveless on: 08/01/2020 07:19 AM   Modules accepted: Orders

## 2020-08-22 NOTE — Progress Notes (Signed)
Patient was called twice. Unable to leave VM. She never got on mychart for appt either. No Show.

## 2020-08-25 ENCOUNTER — Telehealth (INDEPENDENT_AMBULATORY_CARE_PROVIDER_SITE_OTHER): Payer: Self-pay | Admitting: Physician Assistant

## 2020-08-25 DIAGNOSIS — Z5329 Procedure and treatment not carried out because of patient's decision for other reasons: Secondary | ICD-10-CM

## 2021-02-02 ENCOUNTER — Ambulatory Visit: Payer: BC Managed Care – PPO | Admitting: Podiatry

## 2021-02-09 ENCOUNTER — Ambulatory Visit: Payer: BC Managed Care – PPO | Admitting: Podiatry

## 2021-02-12 ENCOUNTER — Ambulatory Visit (INDEPENDENT_AMBULATORY_CARE_PROVIDER_SITE_OTHER): Payer: BC Managed Care – PPO | Admitting: Podiatry

## 2021-02-12 ENCOUNTER — Other Ambulatory Visit: Payer: Self-pay

## 2021-02-12 ENCOUNTER — Ambulatory Visit (INDEPENDENT_AMBULATORY_CARE_PROVIDER_SITE_OTHER): Payer: BC Managed Care – PPO

## 2021-02-12 DIAGNOSIS — M21611 Bunion of right foot: Secondary | ICD-10-CM

## 2021-02-12 DIAGNOSIS — M79672 Pain in left foot: Secondary | ICD-10-CM

## 2021-02-12 DIAGNOSIS — M21612 Bunion of left foot: Secondary | ICD-10-CM | POA: Diagnosis not present

## 2021-02-12 DIAGNOSIS — M79671 Pain in right foot: Secondary | ICD-10-CM

## 2021-02-12 DIAGNOSIS — M21619 Bunion of unspecified foot: Secondary | ICD-10-CM

## 2021-02-18 NOTE — Progress Notes (Signed)
Subjective:   Patient ID: Tonya Copeland, female   DOB: 30 y.o.   MRN: [id number]   HPI 30 year old female presents the office today for concerns of bilateral bunions.  She states that both are equally painful.  She states that pain is on a consistent basis in the bunions been getting worse over the years.  Also states that she has a family history of bunions.  Closing shoes makes the symptoms worse and she is tried changing shoes.  She was discussed with her options.  She has no other concerns.   Review of Systems  All other systems reviewed and are negative.  No past medical history on file.  No past surgical history on file.   Current Outpatient Medications:  .  citalopram (CELEXA) 20 MG tablet, Start with 1/2 tab (10mg ) PO daily x 1 week, then increase to 1 tab (20mg ) PO daily, Disp: 30 tablet, Rfl: 3 .  levonorgestrel-ethinyl estradiol (AVIANE) 0.1-20 MG-MCG tablet, Take 1 tablet by mouth daily., Disp: 84 tablet, Rfl: 4 .  metroNIDAZOLE (FLAGYL) 500 MG tablet, Take 1 tablet (500 mg total) by mouth 2 (two) times daily., Disp: 14 tablet, Rfl: 0  No Known Allergies         Objective:  Physical Exam  General: AAO x3, NAD  Dermatological: Minimal erythema on the bunion sites bilaterally from irritation of there is no skin breakdown, warmth or any signs of infection.  Vascular: Dorsalis Pedis artery and Posterior Tibial artery pedal pulses are 2/4 bilateral with immedate capillary fill time.  There is no pain with calf compression, swelling, warmth, erythema.   Neruologic: Grossly intact via light touch bilateral.   Musculoskeletal: Moderate to severe bunions are present bilaterally.  Hypermobility present of the first ray.  Tenderness present on the bunion site but no other areas of discomfort.  Muscular strength 5/5 in all groups tested bilateral.  Gait: Unassisted, Nonantalgic.       Assessment:   Bilateral symptomatic bunion deformity     Plan:  -Treatment options  discussed including all alternatives, risks, and complications -Etiology of symptoms were discussed -X-rays were obtained and reviewed with the patient.  Significant bunion is present.  There is no evidence of acute fracture identified. -We discussed both conservative as well as surgical treatment options.  Conservative discussed different shoes, offloading pads as well as topical anti-inflammatories.  Discussed surgical intervention as well.  She does not consider surgical intervention.  I gave her our surgery scheduler's number if she wishes to proceed with surgical and is now having give her scheduld.  07-07-2005 DPM

## 2021-03-17 ENCOUNTER — Other Ambulatory Visit: Payer: Self-pay

## 2021-03-17 ENCOUNTER — Encounter: Payer: Self-pay | Admitting: Podiatry

## 2021-03-17 ENCOUNTER — Ambulatory Visit (INDEPENDENT_AMBULATORY_CARE_PROVIDER_SITE_OTHER): Payer: BC Managed Care – PPO | Admitting: Podiatry

## 2021-03-17 DIAGNOSIS — M21612 Bunion of left foot: Secondary | ICD-10-CM | POA: Diagnosis not present

## 2021-03-17 DIAGNOSIS — M21611 Bunion of right foot: Secondary | ICD-10-CM | POA: Diagnosis not present

## 2021-03-17 DIAGNOSIS — M79671 Pain in right foot: Secondary | ICD-10-CM | POA: Diagnosis not present

## 2021-03-17 DIAGNOSIS — M79672 Pain in left foot: Secondary | ICD-10-CM | POA: Diagnosis not present

## 2021-03-17 NOTE — Patient Instructions (Signed)

## 2021-03-18 ENCOUNTER — Encounter: Payer: Self-pay | Admitting: Podiatry

## 2021-03-23 NOTE — Progress Notes (Signed)
Subjective:   Patient ID: Tonya Copeland, female   DOB: 30 y.o.   MRN: [id number]   HPI  30 year old female presents the office today for surgical consultation given ongoing bunion pain.  We discussed different options last appointment she wants to go and proceed with surgery.  She is attempted numerous conservative treatments including shoe modifications, offloading that is significant improvement.  She states they both hurt about the same.   Review of Systems  All other systems reviewed and are negative.  History reviewed. No pertinent past medical history.  History reviewed. No pertinent surgical history.   Current Outpatient Medications:    benzonatate (TESSALON) 200 MG capsule, Take by mouth., Disp: , Rfl:    guaiFENesin-codeine (ROBITUSSIN AC) 100-10 MG/5ML syrup, Take by mouth., Disp: , Rfl:    citalopram (CELEXA) 20 MG tablet, Start with 1/2 tab (10mg ) PO daily x 1 week, then increase to 1 tab (20mg ) PO daily, Disp: 30 tablet, Rfl: 3   levonorgestrel-ethinyl estradiol (AVIANE) 0.1-20 MG-MCG tablet, Take 1 tablet by mouth daily., Disp: 84 tablet, Rfl: 4   metroNIDAZOLE (FLAGYL) 500 MG tablet, Take 1 tablet (500 mg total) by mouth 2 (two) times daily., Disp: 14 tablet, Rfl: 0  No Known Allergies        Objective:  Physical Exam  General: AAO x3, NAD  Dermatological: Skin is warm, dry and supple bilateral. There are no open sores, no preulcerative lesions, no rash or signs of infection present.  Vascular: Dorsalis Pedis artery and Posterior Tibial artery pedal pulses are 2/4 bilateral with immedate capillary fill time. There is no pain with calf compression, swelling, warmth, erythema.   Neruologic: Grossly intact via light touch bilateral.   Musculoskeletal: Moderate severe bunions are present bilaterally with hypermobility present of the first ray.  No pain or crepitation or restriction with first MPJ range of motion.  There is palpation directly on the bunion sites.  No  other areas of discomfort identified today.  Muscular strength 5/5 in all groups tested bilateral.  Gait: Unassisted, Nonantalgic.       Assessment:   Symptomatic bunion     Plan:  -Treatment options discussed including all alternatives, risks, and complications -Etiology of symptoms were discussed -Reviewed the x-rays with her again today.  Discussed with conservative as well as surgical treatment options.  We discussed various surgical options as she wants to proceed with this.  After discussion elects to proceed with LEFT LAPIDUS BUNIONECTOMY.  -The incision placement as well as the postoperative course was discussed with the patient. I discussed risks of the surgery which include, but not limited to, infection, bleeding, pain, swelling, need for further surgery, delayed or nonhealing, painful or ugly scar, numbness or sensation changes, over/under correction, recurrence, transfer lesions, further deformity, hardware failure, DVT/PE, loss of toe/foot. Patient understands these risks and wishes to proceed with surgery. The surgical consent was reviewed with the patient all 3 pages were signed. No promises or guarantees were given to the outcome of the procedure. All questions were answered to the best of my ability. Before the surgery the patient was encouraged to call the office if there is any further questions. The surgery will be performed at the [facility] on an outpatient basis. -We will order knee scooter postoperatively as difficulty using crutches and this will help increase her mobility postoperatively.    07-07-2005 DPM

## 2021-04-15 ENCOUNTER — Other Ambulatory Visit: Payer: Self-pay | Admitting: Physician Assistant

## 2021-04-15 DIAGNOSIS — F320 Major depressive disorder, single episode, mild: Secondary | ICD-10-CM

## 2021-05-25 ENCOUNTER — Telehealth: Payer: Self-pay | Admitting: Urology

## 2021-05-25 NOTE — Telephone Encounter (Signed)
DOS  - 06/10/21  AIKEN OSTEOTOMY LEFT --- [id number] LAPIDUS PROC. IN. BUNIONECTOMY LEFT --- [id number]   [employer] EFFECTIVE DATE - 04/10/21   PER [employer]'S AUT[employer]IVE SYSTEM CPT CODE 94320 AND 28297 NO PRIOR AUTH IS REQUIRED.  REF # [id number]

## 2021-06-01 ENCOUNTER — Encounter: Payer: Self-pay | Admitting: Podiatry

## 2021-06-10 ENCOUNTER — Encounter: Payer: Self-pay | Admitting: Podiatry

## 2021-06-10 ENCOUNTER — Telehealth: Payer: Self-pay | Admitting: *Deleted

## 2021-06-10 ENCOUNTER — Other Ambulatory Visit: Payer: Self-pay | Admitting: Podiatry

## 2021-06-10 DIAGNOSIS — M2042 Other hammer toe(s) (acquired), left foot: Secondary | ICD-10-CM | POA: Diagnosis not present

## 2021-06-10 DIAGNOSIS — M2012 Hallux valgus (acquired), left foot: Secondary | ICD-10-CM | POA: Diagnosis not present

## 2021-06-10 MED ORDER — OXYCODONE-ACETAMINOPHEN 5-325 MG PO TABS: 1.0000 | ORAL_TABLET | Freq: Four times a day (QID) | ORAL | 0 refills | Status: DC | PRN

## 2021-06-10 MED ORDER — ONDANSETRON HCL 4 MG PO TABS: 4.0000 mg | ORAL_TABLET | Freq: Three times a day (TID) | ORAL | 0 refills | Status: DC | PRN

## 2021-06-10 MED ORDER — CEPHALEXIN 500 MG PO CAPS: 500.0000 mg | ORAL_CAPSULE | Freq: Three times a day (TID) | ORAL | 0 refills | Status: DC

## 2021-06-10 NOTE — Telephone Encounter (Signed)
Patient is calling because her pain medicine is not at her pharmacy. Called [facility], they do not have it on file. Please advise.

## 2021-06-10 NOTE — Progress Notes (Signed)
Post- medications sent  ?

## 2021-06-11 ENCOUNTER — Telehealth: Payer: Self-pay | Admitting: *Deleted

## 2021-06-11 NOTE — Telephone Encounter (Signed)
Called and spoke with the patient and stated that I was calling to see how the patient was doing after having surgery with Dr Ardelle Anton and patient stated that she has been better and that the nerve block wore off last night and that the pain was a 10 and was icing and elevating and did have some nausea yesterday and I stated to make sure to eat something before taken the pain medicine and the patient understood and I stated to call the office if any concerns or questions. Misty Stanley

## 2021-06-16 ENCOUNTER — Ambulatory Visit (INDEPENDENT_AMBULATORY_CARE_PROVIDER_SITE_OTHER): Payer: No Typology Code available for payment source | Admitting: Podiatry

## 2021-06-16 ENCOUNTER — Ambulatory Visit (INDEPENDENT_AMBULATORY_CARE_PROVIDER_SITE_OTHER): Payer: No Typology Code available for payment source

## 2021-06-16 ENCOUNTER — Other Ambulatory Visit: Payer: Self-pay

## 2021-06-16 VITALS — Temp 98.8°F

## 2021-06-16 DIAGNOSIS — M21611 Bunion of right foot: Secondary | ICD-10-CM

## 2021-06-16 DIAGNOSIS — M21612 Bunion of left foot: Secondary | ICD-10-CM

## 2021-06-16 MED ORDER — OXYCODONE-ACETAMINOPHEN 5-325 MG PO TABS: 1.0000 | ORAL_TABLET | Freq: Four times a day (QID) | ORAL | 0 refills | Status: DC | PRN

## 2021-06-16 MED ORDER — CEPHALEXIN 500 MG PO CAPS: 500.0000 mg | ORAL_CAPSULE | Freq: Three times a day (TID) | ORAL | 0 refills | Status: DC

## 2021-06-19 ENCOUNTER — Other Ambulatory Visit: Payer: Self-pay

## 2021-06-19 ENCOUNTER — Ambulatory Visit (INDEPENDENT_AMBULATORY_CARE_PROVIDER_SITE_OTHER): Payer: No Typology Code available for payment source | Admitting: Podiatry

## 2021-06-19 DIAGNOSIS — Z9889 Other specified postprocedural states: Secondary | ICD-10-CM

## 2021-06-19 DIAGNOSIS — M21611 Bunion of right foot: Secondary | ICD-10-CM

## 2021-06-20 NOTE — Progress Notes (Signed)
Subjective: Tonya Copeland is a 30 y.o. is seen today in office s/p right foot Lapidus, Akin osteotomy preformed on 06/10/2021.  States that she is having some pain but is controlled by pain medicine.  She has not had any fevers.  She is been nonweightbearing using a cam boot.  No other concerns today.   Objective: General: No acute distress, AAOx3  DP/PT pulses palpable 2/4, CRT < 3 sec to all digits.  Protective sensation intact. Motor function intact.  Right foot: Incision is well coapted without any evidence of dehiscence.  There is bruising appears to be also a blister, hemorrhagic blister just adjacent to the incision site.  It appears that the bandage has been rubbing the top of the foot and there is preulcerative blanchable erythema noted to the fifth metatarsal base as well as to the dorsal aspect of foot.  There is some erythema surrounding the incision as well.  There is no purulence identified there is no fluctuance or crepitation.  There is no malodor.  No significant warmth of the foot. No other open lesions or pre-ulcerative lesions.  No pain with calf compression, swelling, warmth, erythema.   Assessment and Plan:  Status post right bunion surgery with localized erythema, pressure with hemorrhagic blister  -Treatment options discussed including all alternatives, risks, and complications -X-rays obtained reviewed status post bunionectomy with hardware intact any complicating factors. -There is not much blister fluid is able to drain today.  I do think that the bandage as well as the boot was pretty much pressure on the foot which is causing some erythema.  A new boot was dispensed today due to this.  I cleaned the incision and a small amount of Betadine was applied followed by dressing.  Encouraged elevation, nonweightbearing.  We will continue antibiotics and refill Keflex.  Monitor closely for any signs or symptoms of infection, no immediately.  I do not see her back on Friday as a  precaution for dressing change per there is any worsening prior to that and let me know.  No follow-ups on file.  Vivi Barrack DPM

## 2021-06-24 NOTE — Progress Notes (Signed)
Subjective: Tonya Copeland is a 30 y.o. is seen today in office s/p right foot Lapidus, Akin osteotomy preformed on 06/10/2021.  She presents today for dressing change.  No fevers or chills.  She is on antibiotics.  She is been nonweightbearing with a cam boot.  No recent injuries.  No other concerns.   Objective: General: No acute distress, AAOx3  DP/PT pulses palpable 2/4, CRT < 3 sec to all digits.  Protective sensation intact. Motor function intact.  Right foot: Incision is well coapted without any evidence of dehiscence.  Hemorrhagic blister along the incision which is able to drain on the clear blister fluid is expressed there is no purulence.  Incision itself is coapted and there is no dehiscence noted.  There is decreased erythema present to the foot compared to when I saw her earlier in the week but does still remain.  There is no ascending cellulitis.  Still some erythema along the fifth metatarsal base, head from pressure.  No skin breakdown.  No fluctuance or crepitation.  No malodor. No pain with calf compression, swelling, warmth, erythema.   Assessment and Plan:  Status post right bunion surgery with localized erythema, pressure with hemorrhagic blister  -Treatment options discussed including all alternatives, risks, and complications -Unable to drain the blister today.  Not cultures are no purulence and appear to be regular blister.  Incision is well coapted.  I put a small amount of Betadine to the area today and dressing was applied make sure to pad all bony prominences.  Recommended her to stop the baby aspirin as well that I had her taking postoperatively.  Continue to ice and elevate as well as nonweightbearing.  Continue antibiotics.  Monitor for any signs or symptoms of infection reported emergency room call the office immediately should any occur.  Otherwise I will see her back next week for dressing change.  Vivi Barrack DPM

## 2021-06-25 ENCOUNTER — Ambulatory Visit (INDEPENDENT_AMBULATORY_CARE_PROVIDER_SITE_OTHER): Payer: No Typology Code available for payment source | Admitting: Podiatry

## 2021-06-25 ENCOUNTER — Other Ambulatory Visit: Payer: Self-pay

## 2021-06-25 DIAGNOSIS — M21611 Bunion of right foot: Secondary | ICD-10-CM

## 2021-06-25 DIAGNOSIS — Z9889 Other specified postprocedural states: Secondary | ICD-10-CM

## 2021-06-29 NOTE — Progress Notes (Signed)
Subjective: Tonya Copeland is a 30 y.o. is seen today in office s/p right foot Lapidus, Akin osteotomy preformed on 06/10/2021.  She presents today for dressing change.  She states her pain is improved and she can tell the swelling has come down as the toe swelling on the big toe is improved.  Not taking any pain medication.  No fevers or chills that she reports.  No other concerns.    Objective: General: No acute distress, AAOx3  DP/PT pulses palpable 2/4, CRT < 3 sec to all digits.  Protective sensation intact. Motor function intact.  Right foot: Upon removal of bandages small mount of dried blood present in the bandage.  Upon removal incision still well coapted without any evidence of dehiscence.  There was still blood blister along the incision as well as mildly along the second metatarsal incision site.  I was able to remove the fluid upon gentle compression and only blood hemorrhagic fluid was expressed.  There is no purulence.  Upon wiping the area with gauze some of the top layer of the blister skin was removed and underlying skin intact.  The erythema that she had previously is almost completely resolved.  There is no significant increased warmth of the foot.  There is no fluctuance or crepitation but there is no malodor. No pain with calf compression, swelling, warmth, erythema.   Assessment and Plan:  Status post right bunion surgery with localized erythema, pressure with hemorrhagic blister  -Treatment options discussed including all alternatives, risks, and complications -I was able to clean the incisions today to remove some of the blister skin with minimal whitening of the skin.  There is no purulence.  The erythema that she had has almost completely resolved.  She is almost finished with antibiotics no report of any further antibiotics.  Discussed that she can start to change the bandage a couple times a week and I gave her supplies to do this.  Continue nonweightbearing in her cam  boot.  Continue ice elevate.  Monitor closely for any signs or symptoms of infection and report to the emergency department or call the office should any occur.  Return in about 1 week (around 07/02/2021).  Vivi Barrack DPM

## 2021-07-02 ENCOUNTER — Ambulatory Visit (INDEPENDENT_AMBULATORY_CARE_PROVIDER_SITE_OTHER): Payer: No Typology Code available for payment source | Admitting: Podiatry

## 2021-07-02 ENCOUNTER — Other Ambulatory Visit: Payer: Self-pay

## 2021-07-02 DIAGNOSIS — Z9889 Other specified postprocedural states: Secondary | ICD-10-CM

## 2021-07-08 ENCOUNTER — Other Ambulatory Visit: Payer: Self-pay

## 2021-07-08 ENCOUNTER — Ambulatory Visit (INDEPENDENT_AMBULATORY_CARE_PROVIDER_SITE_OTHER): Payer: No Typology Code available for payment source | Admitting: Podiatry

## 2021-07-08 DIAGNOSIS — Z9889 Other specified postprocedural states: Secondary | ICD-10-CM

## 2021-07-08 DIAGNOSIS — M21611 Bunion of right foot: Secondary | ICD-10-CM

## 2021-07-08 NOTE — Progress Notes (Signed)
Subjective: Tonya Copeland is a 30 y.o. is seen today in office s/p right foot Lapidus, Akin osteotomy preformed on 06/10/2021.  She presents today for dressing change and wound follow-up.  She states that the wound is doing better.  Not had significant drainage or pus.  She will change the bandage.  She has no fevers or chills.  No other concerns today.  Objective: General: No acute distress, AAOx3  DP/PT pulses palpable 2/4, CRT < 3 sec to all digits.  Protective sensation intact. Motor function intact.  Right foot: Upon removal of bandages small mount of dried blood present in the bandage.  On removal there is still dry scab, dried blister present on the entire incision extending approximately a centimeter and half out as well.  There is no fluctuation crepitation.  There is no erythema or warmth.  No significant pain on exam. No pain with calf compression, swelling, warmth, erythema.   Assessment and Plan:  Status post right bunion surgery with localized erythema, pressure with hemorrhagic blister  -Treatment options discussed including all alternatives, risks, and complications -Overall the posterior segment dried up and I was able to debride some of the loose tissue without any complications or bleeding today.  Continue with dressing changes.  Monitor closely for any signs or symptoms of infection.  To me know immediately should any occur otherwise I will see her back in 1 week.  Continue nonweightbearing in the cam boot.  *Patient just found out she is pregnant  Return in about 1 week (around 07/09/2021).  Tonya Copeland DPM

## 2021-07-10 NOTE — Progress Notes (Signed)
Subjective: Tonya Copeland is a 30 y.o. is seen today in office s/p right foot Lapidus, Akin osteotomy preformed on 06/10/2021.  She presents today for wound check.  She has been keeping the incision clean and dry and using a small amount of Betadine.  No drainage or pus that she reports no increased swelling or redness.  No pain.  She has no other concerns.  No fevers or chills that she reports.   Objective: General: No acute distress, AAOx3  DP/PT pulses palpable 2/4, CRT < 3 sec to all digits.  Protective sensation intact. Motor function intact.  Right foot: Upon removal of bandages on the incision there is still dried callus, hemorrhagic blister surrounding the incision.  It is starting to dry up and loosen and I was able to debride some of the loose tissue there is new, healthy, pink skin present.  There is no purulence noted.  No surrounding erythema, ascending cellulitis.  There is no fluctuation or crepitation.  There is no malodor.  No exposed bone or hardware tendon.  The actual incision appears to be coapted. No pain with calf compression, swelling, warmth, erythema.   Assessment and Plan:  Status post right bunion surgery with localized erythema, pressure with hemorrhagic blister  -Treatment options discussed including all alternatives, risks, and complications -Was able to debride some loose tissue today.  Continue with a small amount of Betadine and a dressing.  She can wash the foot gently with soap and water and dry thoroughly.  Continue nonweightbearing, cam boot, elevation and compression. -Monitor for any clinical signs or symptoms of infection and directed to call the office immediately should any occur or go to the ER.  Follow-up in 1 week or sooner if needed  *Patient is pregnant.  Tonya Copeland DPM

## 2021-07-13 ENCOUNTER — Ambulatory Visit (INDEPENDENT_AMBULATORY_CARE_PROVIDER_SITE_OTHER): Payer: No Typology Code available for payment source | Admitting: Podiatry

## 2021-07-13 ENCOUNTER — Encounter: Payer: Self-pay | Admitting: Podiatry

## 2021-07-13 ENCOUNTER — Other Ambulatory Visit: Payer: Self-pay

## 2021-07-13 DIAGNOSIS — Z9889 Other specified postprocedural states: Secondary | ICD-10-CM

## 2021-07-13 DIAGNOSIS — M21611 Bunion of right foot: Secondary | ICD-10-CM

## 2021-07-14 NOTE — Progress Notes (Signed)
Subjective: Tonya Copeland is a 30 y.o. is seen today in office s/p right foot Lapidus, Akin osteotomy preformed on 06/10/2021.  She presents today for wound check.  She states that over the weekend she did wash it with soap and water and she did not see any drainage or pus.  No pain that she reports.  No increase in swelling or redness.  No drainage.  She denies any fevers or chills.  No other concerns today.   Objective: General: No acute distress, AAOx3  DP/PT pulses palpable 2/4, CRT < 3 sec to all digits.  Protective sensation intact. Motor function intact.  Right foot: Upon removal of bandages on the incision there is still dried callus, hemorrhagic blister surrounding the incision.  The incision still well coapted without any dehiscence.  On the proximal portion incision is improving.  Distally is able to remove some loose skin and there is healthy, pink tissue underneath.  There is no drainage or pus.  No fluctuance crepitation.  There is no malodor.  No pain.  Toe sits in rectus position there is still pain at the first MPJ. No pain with calf compression, swelling, warmth, erythema.   Assessment and Plan:  Status post right bunion surgery with localized erythema, pressure with hemorrhagic blister  -Treatment options discussed including all alternatives, risks, and complications -Was able to debride some loose tissue today.  Continue with a small amount of Betadine and a dressing.  She can wash the foot gently with soap and water and dry thoroughly.  Continue nonweightbearing, cam boot, elevation and compression. -Discussed starting gentle range of motion exercises for the first MPJ. -Monitor for any clinical signs or symptoms of infection and directed to call the office immediately should any occur or go to the ER.  Follow-up in 1 week or sooner if needed  *Patient is pregnant.  Vivi Barrack DPM

## 2021-07-20 ENCOUNTER — Ambulatory Visit (INDEPENDENT_AMBULATORY_CARE_PROVIDER_SITE_OTHER): Payer: No Typology Code available for payment source

## 2021-07-20 ENCOUNTER — Other Ambulatory Visit: Payer: Self-pay

## 2021-07-20 ENCOUNTER — Ambulatory Visit (INDEPENDENT_AMBULATORY_CARE_PROVIDER_SITE_OTHER): Payer: No Typology Code available for payment source | Admitting: Podiatry

## 2021-07-20 DIAGNOSIS — M21611 Bunion of right foot: Secondary | ICD-10-CM | POA: Diagnosis not present

## 2021-07-20 DIAGNOSIS — Z9889 Other specified postprocedural states: Secondary | ICD-10-CM

## 2021-07-21 LAB — OB RESULTS CONSOLE ABO/RH: RH Type: POSITIVE

## 2021-07-21 LAB — OB RESULTS CONSOLE RPR: RPR: NONREACTIVE

## 2021-07-21 LAB — OB RESULTS CONSOLE ANTIBODY SCREEN: Antibody Screen: NEGATIVE

## 2021-07-21 LAB — OB RESULTS CONSOLE HEPATITIS B SURFACE ANTIGEN: Hepatitis B Surface Ag: NEGATIVE

## 2021-07-21 LAB — OB RESULTS CONSOLE RUBELLA ANTIBODY, IGM: Rubella: IMMUNE

## 2021-07-21 LAB — OB RESULTS CONSOLE HIV ANTIBODY (ROUTINE TESTING): HIV: NONREACTIVE

## 2021-07-21 LAB — HEPATITIS C ANTIBODY: HCV Ab: NEGATIVE

## 2021-07-25 NOTE — Progress Notes (Signed)
Subjective: Tonya Copeland is a 30 y.o. is seen today in office s/p right foot Lapidus, Akin osteotomy preformed on 06/10/2021.  She presents today for wound check.  She has been changing the bandage and she has not seen any drainage or pus no swelling or redness.  Has not been having any pain.  Feedings is looking better.  No fevers or chills.  No other concerns.    Objective: General: No acute distress, AAOx3  DP/PT pulses palpable 2/4, CRT < 3 sec to all digits.  Protective sensation intact. Motor function intact.  Right foot: Upon removal of bandages on the incision there is still dried callus, hemorrhagic blister surrounding the incision.  Proximally some of the tissue is coming off there is new healthy skin present.  Still well adhered on the distal portion of the incision.  There is no erythema or warmth there is no drainage or pus.  It appears to be dry today.  There is no fluctuation crepitation.  There is no malodor.  There is stiffness of the first MPJ noted. No pain with calf compression, swelling, warmth, erythema.   Assessment and Plan:  Status post right bunion surgery with localized erythema, pressure with hemorrhagic blister  -Treatment options discussed including all alternatives, risks, and complications -X-rays were obtained and reviewed.  We did take precautions and she was double leaded for the x-ray.  Also she discussed with her OB/GYN as well prior to the x-ray.  Hardware intact.  Elevation of first ray. -Was able to debride some loose tissue today.  Area that is all scabbed over and callused over starting to come off there is new healthy skin present.  There is no purulence or drainage.  No obvious signs of infection.  There is no exposed bone tendon or hardware.  For now we will continue Betadine to keep the area clean and dry and hopefully the rest of all the necrotic tissue will come off.  -Discussed instructed partial weightbearing for transition to weightbearing in the  cam boot.  Encouraged elevation still particularly while at work.   -Unfortunate first MPJ stiffened up but have been more concern about the wound healing.  Discussed she can start some gentle range of motion exercises.   -Monitor closely for other symptoms of infection call the office or report to the emergency department should any occur.  *Patient is pregnant  Vivi Barrack DPM

## 2021-07-27 ENCOUNTER — Other Ambulatory Visit: Payer: Self-pay | Admitting: Family Medicine

## 2021-07-27 DIAGNOSIS — F320 Major depressive disorder, single episode, mild: Secondary | ICD-10-CM

## 2021-07-27 NOTE — Telephone Encounter (Signed)
I called pt and left a voicemail to call [facility] and make an appt for yearly exam and refills.  I gave her a 30 day courtesy supply of the Celexa.

## 2021-07-29 ENCOUNTER — Telehealth: Payer: Self-pay | Admitting: *Deleted

## 2021-07-29 NOTE — Telephone Encounter (Signed)
Called and left a message for the patient stating that I was calling to see how she was doing and to call the office if any concerns or questions. Tonya Copeland

## 2021-08-01 ENCOUNTER — Encounter: Payer: Self-pay | Admitting: Podiatry

## 2021-08-02 ENCOUNTER — Other Ambulatory Visit: Payer: Self-pay | Admitting: Podiatry

## 2021-08-02 MED ORDER — CEPHALEXIN 500 MG PO CAPS: 500.0000 mg | ORAL_CAPSULE | Freq: Three times a day (TID) | ORAL | 0 refills | Status: DC

## 2021-08-02 NOTE — Progress Notes (Signed)
x

## 2021-08-03 ENCOUNTER — Ambulatory Visit (INDEPENDENT_AMBULATORY_CARE_PROVIDER_SITE_OTHER): Payer: No Typology Code available for payment source | Admitting: Podiatry

## 2021-08-03 ENCOUNTER — Other Ambulatory Visit: Payer: Self-pay

## 2021-08-03 VITALS — BP 105/54 | Temp 97.7°F

## 2021-08-03 DIAGNOSIS — T8130XA Disruption of wound, unspecified, initial encounter: Secondary | ICD-10-CM

## 2021-08-03 DIAGNOSIS — M21611 Bunion of right foot: Secondary | ICD-10-CM

## 2021-08-03 MED ORDER — CEPHALEXIN 500 MG PO CAPS: 500.0000 mg | ORAL_CAPSULE | Freq: Three times a day (TID) | ORAL | 0 refills | Status: DC

## 2021-08-03 NOTE — Progress Notes (Signed)
Subjective: Tonya Copeland is a 30 y.o. is seen today in office s/p right foot Lapidus, Akin osteotomy preformed on 06/10/2021.  She presents today for same-day appointment she is admitted over the weekend stating that her surgical incision has opened up.  She has had some drainage but denies any pus.  Denies any significant increase in pain.  She been changing the bandage with a small amount of Betadine and scab is from a come off.  No fevers or chills that she reports.  She has no other concerns.   Objective: General: No acute distress, AAOx3  DP/PT pulses palpable 2/4, CRT < 3 sec to all digits.  Protective sensation intact. Motor function intact.  Right foot: Upon removal of bandages there is still some dried scab, blister which is present.  There does appear to be opening to the level of the fat layer of the incision distally but there is no exposed bone or tendon.  Fibrotic wound base present.  Unable to elicit any areas of fluctuation crepitation.  There is no purulence.  No significant increase in edema, erythema to the foot.  No pain. No pain with calf compression, swelling, warmth, erythema.   Assessment and Plan:  Status post right bunion surgery with localized erythema, pressure with hemorrhagic blister;  -Treatment options discussed including all alternatives, risks, and complications -At this point we will order blood work including CBC, CRP, sed rate.  For now continue small amount of Betadine to the wound followed by dressing.  We will likely switch to [facility] next appointment.  Encouraged elevation, limit weightbearing. -Keflex-written prescription given. -I did not take a wound culture today as this was a superficial wound culture and there is no drainage identified today. -Monitor for any clinical signs or symptoms of infection and directed to call the office immediately should any occur or go to the ER.  *Patient is pregnant  No follow-ups on file.  Vivi Barrack DPM

## 2021-08-04 LAB — OB RESULTS CONSOLE GC/CHLAMYDIA
Chlamydia: NEGATIVE
Gonorrhea: NEGATIVE

## 2021-08-05 LAB — CBC WITH DIFFERENTIAL/PLATELET
Basophils Absolute: 0 10*3/uL (ref 0.0–0.2)
Basos: 0 %
EOS (ABSOLUTE): 0.1 10*3/uL (ref 0.0–0.4)
Eos: 1 %
Hematocrit: 36.6 % (ref 34.0–46.6)
Hemoglobin: 12.3 g/dL (ref 11.1–15.9)
Immature Grans (Abs): 0 10*3/uL (ref 0.0–0.1)
Immature Granulocytes: 0 %
Lymphocytes Absolute: 2.3 10*3/uL (ref 0.7–3.1)
Lymphs: 28 %
MCH: 30.3 pg (ref 26.6–33.0)
MCHC: 33.6 g/dL (ref 31.5–35.7)
MCV: 90 fL (ref 79–97)
Monocytes Absolute: 0.7 10*3/uL (ref 0.1–0.9)
Monocytes: 8 %
Neutrophils Absolute: 5.3 10*3/uL (ref 1.4–7.0)
Neutrophils: 63 %
Platelets: 206 10*3/uL (ref 150–450)
RBC: 4.06 x10E6/uL (ref 3.77–5.28)
RDW: 11.8 % (ref 11.7–15.4)
WBC: 8.5 10*3/uL (ref 3.4–10.8)

## 2021-08-05 LAB — BASIC METABOLIC PANEL
BUN/Creatinine Ratio: 12 (ref 9–23)
BUN: 6 mg/dL (ref 6–20)
CO2: 21 mmol/L (ref 20–29)
Calcium: 9.8 mg/dL (ref 8.7–10.2)
Chloride: 102 mmol/L (ref 96–106)
Creatinine, Ser: 0.52 mg/dL — ABNORMAL LOW (ref 0.57–1.00)
Glucose: 69 mg/dL — ABNORMAL LOW (ref 70–99)
Potassium: 4.3 mmol/L (ref 3.5–5.2)
Sodium: 137 mmol/L (ref 134–144)
eGFR: 129 mL/min/{1.73_m2} (ref >59)

## 2021-08-05 LAB — SEDIMENTATION RATE: Sed Rate: 6 mm/hr (ref 0–32)

## 2021-08-05 LAB — C-REACTIVE PROTEIN: CRP: 4 mg/L (ref 0–10)

## 2021-08-06 ENCOUNTER — Other Ambulatory Visit: Payer: Self-pay

## 2021-08-06 ENCOUNTER — Ambulatory Visit (INDEPENDENT_AMBULATORY_CARE_PROVIDER_SITE_OTHER): Payer: No Typology Code available for payment source | Admitting: Podiatry

## 2021-08-06 DIAGNOSIS — M21611 Bunion of right foot: Secondary | ICD-10-CM

## 2021-08-06 DIAGNOSIS — L97512 Non-pressure chronic ulcer of other part of right foot with fat layer exposed: Secondary | ICD-10-CM

## 2021-08-06 DIAGNOSIS — T8130XA Disruption of wound, unspecified, initial encounter: Secondary | ICD-10-CM

## 2021-08-06 MED ORDER — SANTYL 250 UNIT/GM EX OINT: 1.0000 "application " | TOPICAL_OINTMENT | Freq: Every day | CUTANEOUS | 0 refills | Status: DC

## 2021-08-07 ENCOUNTER — Telehealth: Payer: Self-pay | Admitting: Podiatry

## 2021-08-07 NOTE — Telephone Encounter (Signed)
Prior authorization needed for Santyl. I called [employer] and completed the prior authorization and received approval. Patient notified.

## 2021-08-09 NOTE — Progress Notes (Signed)
Subjective: Tonya Copeland is a 30 y.o. is seen today in office s/p right foot Lapidus, Akin osteotomy preformed on 06/10/2021.  She presents today for follow-up of a wound dehiscence.  She did get the blood work she has been on Keflex.  She has a small mount of redness at the base of the toe but otherwise denies any fevers or chills.  She has no other concerns today.  She is continue with dressing changes.  Objective: General: No acute distress, AAOx3  DP/PT pulses palpable 2/4, CRT < 3 sec to all digits.  Protective sensation intact. Motor function intact.  Right foot: Upon removal of bandages, appears to have some healing.  There is still some necrotic, scab present on the medial first metatarsal head.  There is no exposed bone or tendon today.  Fibrotic wound base is present with some granular tissue present within the wound.  Small mount of edema and erythema noted to the hallux but there is no drainage or pus.  No fluctuation or crepitation.  Erythema is blanchable. No pain with calf compression, swelling, warmth, erythema.   Assessment and Plan:  Status post right bunion surgery with localized erythema, pressure with hemorrhagic blister; wound dehiscence  -Treatment options discussed including all alternatives, risks, and complications -Reviewed blood work.  Her to switch to using Santyl prescription was given.  Discussed daily dressing changes with Santyl, saline wet-to-dry over this.  Continue cam boot, elevation.  Finish course of cephalexin. -Monitor for any clinical signs or symptoms of infection and directed to call the office immediately should any occur or go to the ER.  Return in about 1 week (around 08/13/2021).  Vivi Barrack DPM  *Patient is pregnant

## 2021-08-13 ENCOUNTER — Other Ambulatory Visit: Payer: Self-pay

## 2021-08-13 ENCOUNTER — Ambulatory Visit (INDEPENDENT_AMBULATORY_CARE_PROVIDER_SITE_OTHER): Payer: No Typology Code available for payment source | Admitting: Podiatry

## 2021-08-13 VITALS — Temp 98.5°F

## 2021-08-13 DIAGNOSIS — T8130XA Disruption of wound, unspecified, initial encounter: Secondary | ICD-10-CM

## 2021-08-13 DIAGNOSIS — L97512 Non-pressure chronic ulcer of other part of right foot with fat layer exposed: Secondary | ICD-10-CM

## 2021-08-16 NOTE — Progress Notes (Signed)
Subjective: Tonya Copeland is a 30 y.o. is seen today in office s/p right foot Lapidus, Akin osteotomy preformed on 06/10/2021.  She presents today for wound check.  She did get the [facility] and she started use this.  She is seeing some improvement.  She is having some drainage but no pus.  The redness that she had last appointment has resolved and she finished a course of Keflex.  Denies any fevers or chills.  She has no other concerns.   Objective: General: No acute distress, AAOx3  DP/PT pulses palpable 2/4, CRT < 3 sec to all digits.  Protective sensation intact. Motor function intact.  Right foot: Small amount of necrotic, scab tissue present along medial first MPJ which is able to debride and remove today.  Fibrotic wound base is still present on the wound with smaller granulation tissue.  Proximally appears to be more narrow.  See pictures below.  Pictures below are prior to debridement.  I was able debride all the necrotic tissue today.  There is no purulence noted.       No pain with calf compression, swelling, warmth, erythema.   Assessment and Plan:  Status post right bunion surgery with localized erythema, pressure with hemorrhagic blister; wound dehiscence  -Treatment options discussed including all alternatives, risks, and complications -Sharply debrided the necrotic tissue with a #312 with scalpel today.  I cleaned the wound as well to healthy, bleeding edges to patient comfort.  Clean the wound.  Today applied a regular dressing however continue with Santyl dressing changes daily with a saline wet-to-dry overall.  There is no significant drainage I was able to see today so do not take a wound culture.  Continue ice elevation.  Continue cam boot.  Return in about 10 days (around 08/23/2021) for wound check.  Vivi Barrack DPM  *Patient is pregnant

## 2021-08-25 ENCOUNTER — Other Ambulatory Visit: Payer: Self-pay | Admitting: Family Medicine

## 2021-08-25 ENCOUNTER — Ambulatory Visit (INDEPENDENT_AMBULATORY_CARE_PROVIDER_SITE_OTHER): Payer: No Typology Code available for payment source | Admitting: Podiatry

## 2021-08-25 ENCOUNTER — Other Ambulatory Visit: Payer: Self-pay

## 2021-08-25 DIAGNOSIS — T8130XA Disruption of wound, unspecified, initial encounter: Secondary | ICD-10-CM

## 2021-08-25 DIAGNOSIS — L97512 Non-pressure chronic ulcer of other part of right foot with fat layer exposed: Secondary | ICD-10-CM

## 2021-08-25 DIAGNOSIS — F320 Major depressive disorder, single episode, mild: Secondary | ICD-10-CM

## 2021-08-25 DIAGNOSIS — M21611 Bunion of right foot: Secondary | ICD-10-CM

## 2021-08-25 NOTE — Telephone Encounter (Signed)
Requested medication (s) are due for refill today: yes  Requested medication (s) are on the active medication list  yes   Last refill: 07/27/54 #30  0 refills  Future visit scheduled no  Notes to clinic: needs appt. 30 day courtesy refill was provided on 07/27/21. Unable to reach pt.  Requested Prescriptions  Pending Prescriptions Disp Refills   citalopram (CELEXA) 20 MG tablet [Pharmacy Med Name: CITALOPRAM HBR 20 MG TABLET] 90 tablet 1    Sig: START WITH 1/2 A TABLET BY MOUTH DAILY FOR 1 WEEK, THEN INCREASE TO 1 TAB DAILY     Psychiatry:  Antidepressants - SSRI Failed - 08/25/2021 11:34 AM      Failed - Completed PHQ-2 or PHQ-9 in the last 360 days      Failed - Valid encounter within last 6 months    Recent Outpatient Visits           1 year ago No-show for appointment   [facility] [place], [place], [place]   1 year ago Annual physical exam   [facility] [place], [place], [place]   5 years ago Annual physical exam   [facility] [place], [place], [place]

## 2021-08-28 NOTE — Progress Notes (Signed)
Subjective: Tonya Copeland is a 30 y.o. is seen today in office s/p right foot Lapidus, Akin osteotomy preformed on 06/10/2021.  She presents today wound for wound check given dehiscence.  She is been using Santyl and she thinks it is getting better.  She states the drainage that she has been getting is about the same.  She been applying Santyl followed by wet-to-dry dressing.  No purulence that she notes no red streaks.  Denies any fevers or chills.  She has no other concerns.   Objective: General: No acute distress, AAOx3  DP/PT pulses palpable 2/4, CRT < 3 sec to all digits.  Protective sensation intact. Motor function intact.  Right foot: Today there is no necrotic or scab tissue present the wound is getting smaller proximally.  There is no exposed bone or tendon today.  There is a rim of erythema which I think is more from inflammation as opposed to infection.  There is no fluctuation or crepitation but there is no malodor.  No ascending cellulitis.  No significant pain. No pain with calf compression, swelling, warmth, erythema.   (Picture from today was not saved for some reason)  Assessment and Plan:  Status post right bunion surgery with localized erythema, pressure with hemorrhagic blister; wound dehiscence  -Treatment options discussed including all alternatives, risks, and complications -Likely debrided some of the tissue today without any complications.  For now and continue with Santyl dressing changes daily.  Unable to identify significant drainage today.  Continue offloading, elevation remain in the cam boot.  Monitor closely for any signs or symptoms of infection.  Monitor department erythema and there is any worsening to let me know. -Working on getting a skin substitute approved by insurance in case this is needed.  Submitted to organogenesis.   Return in about 10 days (around 09/04/2021).  Vivi Barrack DPM   *Patient is pregnant

## 2021-09-06 ENCOUNTER — Other Ambulatory Visit: Payer: Self-pay | Admitting: Physician Assistant

## 2021-09-06 DIAGNOSIS — Z30019 Encounter for initial prescription of contraceptives, unspecified: Secondary | ICD-10-CM

## 2021-09-11 ENCOUNTER — Other Ambulatory Visit: Payer: Self-pay

## 2021-09-11 ENCOUNTER — Ambulatory Visit (INDEPENDENT_AMBULATORY_CARE_PROVIDER_SITE_OTHER): Payer: No Typology Code available for payment source | Admitting: Podiatry

## 2021-09-11 DIAGNOSIS — T8130XA Disruption of wound, unspecified, initial encounter: Secondary | ICD-10-CM

## 2021-09-11 DIAGNOSIS — L97512 Non-pressure chronic ulcer of other part of right foot with fat layer exposed: Secondary | ICD-10-CM

## 2021-09-11 MED ORDER — SILVER SULFADIAZINE 1 % EX CREA: 1.0000 "application " | TOPICAL_CREAM | Freq: Every day | CUTANEOUS | 0 refills | Status: DC

## 2021-09-14 NOTE — Progress Notes (Signed)
Subjective: Tonya Copeland is a 30 y.o. is seen today in office s/p right foot Lapidus, Akin osteotomy preformed on 06/10/2021.  She is to continue with Santyl dressing changes daily.  She thinks the wound is getting better.  She has not seen any pus or increasing swelling or redness.  She denies any fevers or chills.  No increasing pain.  She has no other concerns today.  *Patient is pregnant   Objective: General: No acute distress, AAOx3 -still wearing cam boot DP/PT pulses palpable 2/4, CRT < 3 sec to all digits.  Protective sensation intact. Motor function intact.  Right foot: Dehiscence of the wound noted along the distal aspect.  There is granulation tissue present there is no necrotic or significant fibrotic tissue today.  There is still a rim of erythema today which has not changed compared to last appointment there is no ascending cellulitis.  There is no fluctuation or crepitation there is no malodor.  There is no significant pain on exam. No pain with calf compression, swelling, warmth, erythema.   Assessment and Plan:  Status post right bunion surgery with localized erythema, pressure with hemorrhagic blister; wound dehiscence  -Treatment options discussed including all alternatives, risks, and complications -At this point since the wound is granular and is superficial discussed using Silvadene dressing changes for now.  I have tried to get a graft approved by insurance I will follow-up on this as well if needed. -Continue cam boot, offloading.-As tolerated. -Monitor for any clinical signs or symptoms of infection and directed to call the office immediately should any occur or go to the ER.  Return in about 10 days (around 09/21/2021).  Vivi Barrack DPM

## 2021-09-22 ENCOUNTER — Other Ambulatory Visit: Payer: Self-pay

## 2021-09-22 ENCOUNTER — Ambulatory Visit (INDEPENDENT_AMBULATORY_CARE_PROVIDER_SITE_OTHER): Payer: No Typology Code available for payment source | Admitting: Podiatry

## 2021-09-22 DIAGNOSIS — T8130XA Disruption of wound, unspecified, initial encounter: Secondary | ICD-10-CM

## 2021-09-22 DIAGNOSIS — L97512 Non-pressure chronic ulcer of other part of right foot with fat layer exposed: Secondary | ICD-10-CM

## 2021-09-25 NOTE — Progress Notes (Signed)
Subjective: Tonya Copeland is a 30 y.o. is seen today in office s/p right foot Lapidus, Akin osteotomy preformed on 06/10/2021.  Postoperative she had wound dehiscence.  She is using Silvadene on the wound and states that is doing better.  Less swelling or redness as well.  She previously had drainage (although I never saw significant drainage at her appointments) has much improved as well.  Overall she feels well no significant pain.  Denies any fevers or chills.  Has no other concerns.   *Patient is pregnant   Objective: General: No acute distress, AAOx3 -still wearing cam boot DP/PT pulses palpable 2/4, CRT < 3 sec to all digits.  Protective sensation intact. Motor function intact.  Right foot: Dehiscence of the wound noted along the distal aspect.  Granular wound noted at the distal portion of the incision measuring 2.5 x 1 cm.  There is no exposed bone or tendon and I have never been able to identify any exposed bone, tendon or hardware.  There is mild surrounding edema and erythema but I do this is more of an inflammation as opposed to infection.  No fluctuance or crepitation there is no increase in warmth to the foot.  There is no malodor. No pain with calf compression, swelling, warmth, erythema.   Assessment and Plan:  Status post right bunion surgery with localized erythema, pressure with hemorrhagic blister; wound dehiscence  -Treatment options discussed including all alternatives, risks, and complications -I cleaned the wound today.  Continue with Silvadene dressing changes daily encouraged offloading as well as cam boot.  Continue elevation. -Monitor for any clinical signs or symptoms of infection and directed to call the office immediately should any occur or go to the ER.  *Of note she is having a boy. She recently just purchased a house when she is moving in January.  Vivi Barrack DPM

## 2021-10-08 ENCOUNTER — Encounter: Payer: No Typology Code available for payment source | Admitting: Podiatry

## 2021-10-08 ENCOUNTER — Ambulatory Visit: Payer: No Typology Code available for payment source | Admitting: Podiatry

## 2021-10-15 ENCOUNTER — Ambulatory Visit (INDEPENDENT_AMBULATORY_CARE_PROVIDER_SITE_OTHER): Payer: No Typology Code available for payment source | Admitting: Podiatry

## 2021-10-15 ENCOUNTER — Other Ambulatory Visit: Payer: Self-pay

## 2021-10-15 DIAGNOSIS — T8130XA Disruption of wound, unspecified, initial encounter: Secondary | ICD-10-CM

## 2021-10-15 DIAGNOSIS — L97512 Non-pressure chronic ulcer of other part of right foot with fat layer exposed: Secondary | ICD-10-CM

## 2021-10-18 NOTE — Progress Notes (Signed)
Subjective: Tonya Copeland is a 31 y.o. is seen today in office s/p right foot Lapidus, Akin osteotomy preformed on 06/10/2021.  She states that she was doing better and the wound had healed but she did pick a scab and a small opening is still present.  She is still on the Silvadene dressing changes daily.  No significant pain.  She denies any fevers or chills.  No drainage or pus.  She has no new concerns today.   *Patient is pregnant-due in May; moving into a new house tomorrow.   Objective: General: No acute distress, AAOx3 -still wearing cam boot DP/PT pulses palpable 2/4, CRT < 3 sec to all digits.  Protective sensation intact. Motor function intact.  Right foot: On the dorsal aspect of the left first MPJ is a superficial granular wound present for the scab came off.  There is no drainage or pus.  There is no fluctuance or crepitation.  No malodor.  No exposed bone or tendon or hardware.  There is no pain with MPJ range of motion.  No pain along the first metatarsal cuneiform joint.  No pain with calf compression, swelling, warmth, erythema.      Assessment and Plan:  Status post right bunion surgery with localized erythema, pressure with hemorrhagic blister; wound dehiscence  -Treatment options discussed including all alternatives, risks, and complications -No significant tissue to debride today.  Discussed wash with soap and water daily and dry thoroughly.  Apply small amount of Silvadene daily still. -Continue cam boot for now.  Once the wound is fully healed she can transition to regular shoe as tolerated.  Return in about 2 weeks (around 10/29/2021).  Vivi Barrack DPM

## 2021-11-02 ENCOUNTER — Other Ambulatory Visit: Payer: Self-pay

## 2021-11-02 ENCOUNTER — Ambulatory Visit (INDEPENDENT_AMBULATORY_CARE_PROVIDER_SITE_OTHER): Payer: No Typology Code available for payment source | Admitting: Podiatry

## 2021-11-02 ENCOUNTER — Ambulatory Visit (INDEPENDENT_AMBULATORY_CARE_PROVIDER_SITE_OTHER): Payer: No Typology Code available for payment source

## 2021-11-02 ENCOUNTER — Encounter: Payer: Self-pay | Admitting: Podiatry

## 2021-11-02 DIAGNOSIS — T8130XA Disruption of wound, unspecified, initial encounter: Secondary | ICD-10-CM | POA: Diagnosis not present

## 2021-11-02 DIAGNOSIS — L97512 Non-pressure chronic ulcer of other part of right foot with fat layer exposed: Secondary | ICD-10-CM

## 2021-11-02 MED ORDER — MUPIROCIN 2 % EX OINT: 1.0000 "application " | TOPICAL_OINTMENT | Freq: Two times a day (BID) | CUTANEOUS | 2 refills | Status: DC

## 2021-11-06 NOTE — Progress Notes (Signed)
Subjective: Tonya Copeland is a 31 y.o. is seen today in office s/p right foot Lapidus, Akin osteotomy preformed on 06/10/2021.  She states that there is still one area opening but otherwise she been doing well.  Denies any purulence.  No increase in swelling or redness.  Denies any fevers or chills.  She has no pain.    *Patient is pregnant-due in May; moving into a new house tomorrow.   Objective: General: No acute distress, AAOx3 -still wearing cam boot DP/PT pulses palpable 2/4, CRT < 3 sec to all digits.  Protective sensation intact. Motor function intact.  Right foot: On the dorsal aspect of the left first MPJ is a superficial granular wound present for the scab came off.  There is about the same compared to last appointment.  There is no drainage or pus.  Mild surrounding erythema without any fluctuation or crepitation but there is no malodor.  No ascending cellulitis.There is no pain with MPJ range of motion.  There is decreased range of motion of the first MPJ.  No pain along the first metatarsal cuneiform joint.  No pain with calf compression, swelling, warmth, erythema.      Assessment and Plan:  Status post right bunion surgery with localized erythema, pressure with hemorrhagic blister; wound dehiscence  -Treatment options discussed including all alternatives, risks, and complications -X-rays obtained reviewed.  No evidence of acute fracture or increased consolidation noted across the arthrodesis site.  Of note we did double let her for the x-rays and minimize the views. -At this point would switch to using mupirocin ointment which I prescribed.  Monitor closely for any worsening redness that she is afebrile to on the round of antibiotics.  Continue cam boot For immobilization but once the wound is healed she can transition to regular shoe.  Continue to elevate, compression. -Monitor for any clinical signs or symptoms of infection and directed to call the office immediately should any  occur or go to the ER.  Return in about 2 weeks (around 11/16/2021).  Vivi Barrack DPM

## 2021-11-19 ENCOUNTER — Ambulatory Visit (INDEPENDENT_AMBULATORY_CARE_PROVIDER_SITE_OTHER): Payer: No Typology Code available for payment source | Admitting: Podiatry

## 2021-11-19 ENCOUNTER — Encounter: Payer: Self-pay | Admitting: Podiatry

## 2021-11-19 ENCOUNTER — Other Ambulatory Visit: Payer: Self-pay

## 2021-11-19 DIAGNOSIS — M21611 Bunion of right foot: Secondary | ICD-10-CM

## 2021-11-19 DIAGNOSIS — L97512 Non-pressure chronic ulcer of other part of right foot with fat layer exposed: Secondary | ICD-10-CM

## 2021-11-19 DIAGNOSIS — T8130XA Disruption of wound, unspecified, initial encounter: Secondary | ICD-10-CM

## 2021-11-23 NOTE — Progress Notes (Signed)
Subjective: TANIEL WE is a 31 y.o. is seen today in office s/p right foot Lapidus, Akin osteotomy preformed on 06/10/2021.  She states that she has been doing well and the incisions closed.  She is asked about wearing her shoes some has not caused any issues.  She presents today wearing cam boot.  No drainage or pus she reports no redness.  No fevers or chills.  No other concerns.  *Patient is pregnant-due in May  Objective: General: No acute distress, AAOx3 -still wearing cam boot DP/PT pulses palpable 2/4, CRT < 3 sec to all digits.  Protective sensation intact. Motor function intact.  Right foot: At this time on the right foot the incision is healed.  Small and a callus formation is present but there is no opening noted.  There is no areas of drainage.  No fluctuance or crepitation.  No significant erythema.  No tenderness palpation on surgical site.  Decreased range of motion of the first MPJ.         Assessment and Plan:  Status post right bunion surgery with localized erythema, pressure with hemorrhagic blister; wound dehiscence  -Treatment options discussed including all alternatives, risks, and complications -At this time the wound is healed.  Discussed gradual transition back to regular shoe as tolerated.  Discussed different range of motion exercises.  Consider referral to physical therapy if needed as well.  Continue ice elevate as well as compression to help with any postoperative edema.  Return in about 4 weeks (around 12/17/2021).  Trula Slade DPM

## 2021-12-15 ENCOUNTER — Ambulatory Visit: Payer: No Typology Code available for payment source | Admitting: Podiatry

## 2021-12-21 ENCOUNTER — Ambulatory Visit (INDEPENDENT_AMBULATORY_CARE_PROVIDER_SITE_OTHER): Payer: No Typology Code available for payment source | Admitting: Podiatry

## 2021-12-21 ENCOUNTER — Other Ambulatory Visit: Payer: Self-pay

## 2021-12-21 DIAGNOSIS — M21611 Bunion of right foot: Secondary | ICD-10-CM

## 2021-12-21 DIAGNOSIS — T8130XA Disruption of wound, unspecified, initial encounter: Secondary | ICD-10-CM

## 2021-12-23 NOTE — Progress Notes (Signed)
Subjective: ?Tonya Copeland is a 31 y.o. is seen today in office s/p right foot Lapidus, Akin osteotomy preformed on 06/10/2021.  She states that she is doing well she is not having any significant pain.  She is back to her regular shoe.  Range of motion of the first MTPJ is restricted but she states is not limiting her activity.  Incision is still healed there is been no recurrence of the wound.  Slight edema but there is no erythema or warmth.  There is no drainage or signs of infection.  There is no fevers or chills. ? ?*Patient is pregnant-due in May ? ?Objective: ?General: No acute distress, AAOx3 -still wearing cam boot ?DP/PT pulses palpable 2/4, CRT < 3 sec to all digits.  ?Protective sensation intact. Motor function intact.  ?Right foot: At this time on the right foot the incision is healed.  There is no tenderness palpation along the incision there is no open lesions noted.  There is elevation of the first ray noted and there is restriction at first MPJ range of motion but has improved compared to last appointment.  Prominence of the metatarsal heads of the lesser metatarsal heads but there is no pain associated with this area. ? ?Assessment and Plan:  ?Status post right bunion surgery resolved wound ? ?-Treatment options discussed including all alternatives, risks, and complications ?-At this time the wound is healed, and remains healed.  Continue range of motion exercises as well as supportive shoe gear.  Ultimately consider making a custom orthotic to help offload the other lesser MPJs to avoid any pressure, calluses or pain.  However since she is currently pregnant still in some swelling would hold off on this for now.  Referral to physical therapy written for [facility] to help with significant range of motion ? ?Vivi Barrack DPM ?] ?

## 2022-02-02 LAB — OB RESULTS CONSOLE GBS: GBS: NEGATIVE

## 2022-02-25 ENCOUNTER — Telehealth (HOSPITAL_COMMUNITY): Payer: Self-pay | Admitting: *Deleted

## 2022-02-25 ENCOUNTER — Encounter (HOSPITAL_COMMUNITY): Payer: Self-pay | Admitting: *Deleted

## 2022-02-25 NOTE — Telephone Encounter (Signed)
Preadmission screen  

## 2022-02-25 NOTE — H&P (Signed)
Tonya Copeland is a 31 y.o. female presenting for IOL at term. Pregnancy complicated by a history of depression-no meds now. OB History     Gravida  1   Para      Term      Preterm      AB      Living         SAB      IAB      Ectopic      Multiple      Live Births             No past medical history on file. Past Surgical History:  Procedure Laterality Date   BUNIONECTOMY Left    Family History: family history includes Diabetes in her maternal grandfather. Social History:  reports that she has never smoked. She has never used smokeless tobacco. She reports current alcohol use. She reports that she does not use drugs.     Maternal Diabetes: No Genetic Screening: Normal Maternal Ultrasounds/Referrals: Normal Fetal Ultrasounds or other Referrals:  None Maternal Substance Abuse:  No Significant Maternal Medications:  None Significant Maternal Lab Results:  Group B Strep negative Other Comments:  None  Review of Systems  Constitutional:  Negative for fever.  Eyes:  Negative for visual disturbance.  Gastrointestinal:  Negative for abdominal pain.  Neurological:  Negative for headaches.  Maternal Medical History:  Fetal activity: Perceived fetal activity is normal.      There were no vitals taken for this visit. Maternal Exam:  Abdomen: Fetal presentation: vertex  Physical Exam Cardiovascular:     Rate and Rhythm: Normal rate.  Pulmonary:     Effort: Pulmonary effort is normal.    Prenatal labs: ABO, Rh: A/Positive/-- (10/11 0000) Antibody: Negative (10/11 0000) Rubella: Immune (10/11 0000) RPR: Nonreactive (10/11 0000)  HBsAg: Negative (10/11 0000)  HIV: Non-reactive (10/11 0000)  GBS: Negative/-- (04/25 0000)   Assessment/Plan: 31 yo G1P0 @ 39 6/7 weeks for two stage IOL   Roselle Locus II 02/25/2022, 2:00 PM

## 2022-02-27 ENCOUNTER — Inpatient Hospital Stay (HOSPITAL_COMMUNITY): Payer: No Typology Code available for payment source | Admitting: Anesthesiology

## 2022-02-27 ENCOUNTER — Other Ambulatory Visit: Payer: Self-pay

## 2022-02-27 ENCOUNTER — Inpatient Hospital Stay (HOSPITAL_COMMUNITY): Payer: No Typology Code available for payment source

## 2022-02-27 ENCOUNTER — Encounter (HOSPITAL_COMMUNITY): Payer: Self-pay | Admitting: Obstetrics and Gynecology

## 2022-02-27 ENCOUNTER — Inpatient Hospital Stay (HOSPITAL_COMMUNITY)
Admission: AD | Admit: 2022-02-27 | Discharge: 2022-03-01 | DRG: 806 | Disposition: A | Discharge status: Home / Self Care | Payer: No Typology Code available for payment source | Attending: Obstetrics and Gynecology | Admitting: Obstetrics and Gynecology

## 2022-02-27 DIAGNOSIS — Z3490 Encounter for supervision of normal pregnancy, unspecified, unspecified trimester: Principal | ICD-10-CM

## 2022-02-27 DIAGNOSIS — Z3A39 39 weeks gestation of pregnancy: Secondary | ICD-10-CM | POA: Diagnosis not present

## 2022-02-27 DIAGNOSIS — O26893 Other specified pregnancy related conditions, third trimester: Secondary | ICD-10-CM | POA: Diagnosis present

## 2022-02-27 LAB — CBC
HCT: 34.9 % — ABNORMAL LOW (ref 36.0–46.0)
Hemoglobin: 11.6 g/dL — ABNORMAL LOW (ref 12.0–15.0)
MCH: 30.4 pg (ref 26.0–34.0)
MCHC: 33.2 g/dL (ref 30.0–36.0)
MCV: 91.4 fL (ref 80.0–100.0)
Platelets: 164 10*3/uL (ref 150–400)
RBC: 3.82 MIL/uL — ABNORMAL LOW (ref 3.87–5.11)
RDW: 12.2 % (ref 11.5–15.5)
WBC: 9.6 10*3/uL (ref 4.0–10.5)
nRBC: 0 % (ref 0.0–0.2)

## 2022-02-27 LAB — TYPE AND SCREEN
ABO/RH(D): A POS
Antibody Screen: NEGATIVE

## 2022-02-27 LAB — RPR: RPR Ser Ql: NONREACTIVE

## 2022-02-27 MED ORDER — MISOPROSTOL 25 MCG QUARTER TABLET
25.0000 ug | ORAL_TABLET | ORAL | Status: DC | PRN
  Administered 2022-02-27 (×3): 25 ug via VAGINAL
  Filled 2022-02-27 (×3): qty 1

## 2022-02-27 MED ORDER — PHENYLEPHRINE 80 MCG/ML (10ML) SYRINGE FOR IV PUSH (FOR BLOOD PRESSURE SUPPORT)
80.0000 ug | PREFILLED_SYRINGE | INTRAVENOUS | Status: DC | PRN
  Administered 2022-02-28: 80 ug via INTRAVENOUS
  Filled 2022-02-27: qty 10

## 2022-02-27 MED ORDER — OXYCODONE-ACETAMINOPHEN 5-325 MG PO TABS: 1.0000 | ORAL_TABLET | ORAL | Status: DC | PRN

## 2022-02-27 MED ORDER — FENTANYL CITRATE (PF) 100 MCG/2ML IJ SOLN
50.0000 ug | INTRAMUSCULAR | Status: DC | PRN
  Administered 2022-02-27: 100 ug via INTRAVENOUS
  Filled 2022-02-27: qty 2

## 2022-02-27 MED ORDER — SOD CITRATE-CITRIC ACID 500-334 MG/5ML PO SOLN: 30.0000 mL | ORAL | Status: DC | PRN

## 2022-02-27 MED ORDER — OXYTOCIN-SODIUM CHLORIDE 30-0.9 UT/500ML-% IV SOLN
2.5000 [IU]/h | INTRAVENOUS | Status: DC
  Administered 2022-02-28: 2.5 [IU]/h via INTRAVENOUS
  Filled 2022-02-27: qty 500

## 2022-02-27 MED ORDER — FENTANYL-BUPIVACAINE-NACL 0.5-0.125-0.9 MG/250ML-% EP SOLN: 12.0000 mL/h | EPIDURAL | Status: DC | PRN

## 2022-02-27 MED ORDER — LACTATED RINGERS IV SOLN: INTRAVENOUS | Status: DC

## 2022-02-27 MED ORDER — DIPHENHYDRAMINE HCL 50 MG/ML IJ SOLN: 12.5000 mg | INTRAMUSCULAR | Status: DC | PRN

## 2022-02-27 MED ORDER — OXYCODONE-ACETAMINOPHEN 5-325 MG PO TABS: 2.0000 | ORAL_TABLET | ORAL | Status: DC | PRN

## 2022-02-27 MED ORDER — MISOPROSTOL 50MCG HALF TABLET
50.0000 ug | ORAL_TABLET | ORAL | Status: DC | PRN
  Administered 2022-02-27: 50 ug via BUCCAL
  Filled 2022-02-27: qty 1

## 2022-02-27 MED ORDER — ONDANSETRON HCL 4 MG/2ML IJ SOLN: 4.0000 mg | Freq: Four times a day (QID) | INTRAMUSCULAR | Status: DC | PRN

## 2022-02-27 MED ORDER — EPHEDRINE 5 MG/ML INJ: 10.0000 mg | INTRAVENOUS | Status: DC | PRN

## 2022-02-27 MED ORDER — PHENYLEPHRINE 80 MCG/ML (10ML) SYRINGE FOR IV PUSH (FOR BLOOD PRESSURE SUPPORT)
80.0000 ug | PREFILLED_SYRINGE | INTRAVENOUS | Status: AC | PRN
  Administered 2022-02-27 (×3): 80 ug via INTRAVENOUS

## 2022-02-27 MED ORDER — FENTANYL-BUPIVACAINE-NACL 0.5-0.125-0.9 MG/250ML-% EP SOLN
EPIDURAL | Status: AC
  Filled 2022-02-27: qty 250

## 2022-02-27 MED ORDER — LACTATED RINGERS IV SOLN: 500.0000 mL | INTRAVENOUS | Status: DC | PRN

## 2022-02-27 MED ORDER — EPHEDRINE 5 MG/ML INJ
10.0000 mg | INTRAVENOUS | Status: DC | PRN
Start: 2022-02-27 — End: 2022-02-28

## 2022-02-27 MED ORDER — OXYTOCIN BOLUS FROM INFUSION
333.0000 mL | Freq: Once | INTRAVENOUS | Status: AC
  Administered 2022-02-28: 333 mL via INTRAVENOUS

## 2022-02-27 MED ORDER — TERBUTALINE SULFATE 1 MG/ML IJ SOLN
INTRAMUSCULAR | Status: AC
  Filled 2022-02-27: qty 1

## 2022-02-27 MED ORDER — FLEET ENEMA 7-19 GM/118ML RE ENEM: 1.0000 | ENEMA | RECTAL | Status: DC | PRN

## 2022-02-27 MED ORDER — LIDOCAINE HCL (PF) 1 % IJ SOLN
30.0000 mL | INTRAMUSCULAR | Status: AC | PRN
  Administered 2022-02-28: 30 mL via SUBCUTANEOUS
  Filled 2022-02-27: qty 30

## 2022-02-27 MED ORDER — LACTATED RINGERS IV SOLN
500.0000 mL | Freq: Once | INTRAVENOUS | Status: AC
  Administered 2022-02-27: 500 mL via INTRAVENOUS

## 2022-02-27 MED ORDER — TERBUTALINE SULFATE 1 MG/ML IJ SOLN: 0.2500 mg | Freq: Once | INTRAMUSCULAR | Status: DC | PRN

## 2022-02-27 MED ORDER — HYDROXYZINE HCL 50 MG PO TABS: 50.0000 mg | ORAL_TABLET | Freq: Four times a day (QID) | ORAL | Status: DC | PRN

## 2022-02-27 MED ORDER — LIDOCAINE HCL (PF) 1 % IJ SOLN
INTRAMUSCULAR | Status: DC | PRN
  Administered 2022-02-27: 10 mL via EPIDURAL
  Administered 2022-02-27: 2 mL via EPIDURAL

## 2022-02-27 MED ORDER — ACETAMINOPHEN 325 MG PO TABS: 650.0000 mg | ORAL_TABLET | ORAL | Status: DC | PRN

## 2022-02-27 MED ORDER — FENTANYL-BUPIVACAINE-NACL 0.5-0.125-0.9 MG/250ML-% EP SOLN
EPIDURAL | Status: DC | PRN
  Administered 2022-02-27: 12 mL/h via EPIDURAL

## 2022-02-27 NOTE — Anesthesia Preprocedure Evaluation (Signed)
Anesthesia Evaluation  Patient identified by MRN, date of birth, ID band Patient awake    Reviewed: Allergy & Precautions, Patient's Chart, lab work & pertinent test results  Airway Mallampati: II  TM Distance: >3 FB Neck ROM: Full    Dental no notable dental hx.    Pulmonary neg pulmonary ROS,    Pulmonary exam normal breath sounds clear to auscultation       Cardiovascular negative cardio ROS Normal cardiovascular exam Rhythm:Regular Rate:Normal     Neuro/Psych PSYCHIATRIC DISORDERS Depression negative neurological ROS     GI/Hepatic negative GI ROS, Neg liver ROS,   Endo/Other  negative endocrine ROS  Renal/GU negative Renal ROS  negative genitourinary   Musculoskeletal negative musculoskeletal ROS (+)   Abdominal   Peds negative pediatric ROS (+)  Hematology  (+) Blood dyscrasia, anemia , Hb 11.6, plt 164   Anesthesia Other Findings   Reproductive/Obstetrics (+) Pregnancy                             Anesthesia Physical Anesthesia Plan  ASA: 2  Anesthesia Plan: Epidural   Post-op Pain Management:    Induction:   PONV Risk Score and Plan: 2  Airway Management Planned: Natural Airway  Additional Equipment: None  Intra-op Plan:   Post-operative Plan:   Informed Consent: I have reviewed the patients History and Physical, chart, labs and discussed the procedure including the risks, benefits and alternatives for the proposed anesthesia with the patient or authorized representative who has indicated his/her understanding and acceptance.       Plan Discussed with:   Anesthesia Plan Comments:         Anesthesia Quick Evaluation

## 2022-02-27 NOTE — Progress Notes (Signed)
SROM clear UCs q1-3 min, firm Cx 2/90/-2 to -3 D/W patient, if UCs space out will start pitocin

## 2022-02-27 NOTE — Progress Notes (Signed)
FHT cat one UCs q2-3 min Cx closed/30%/-3/vtx/med soft  D/W two stage IOL All questions answered. She states she understands and agrees

## 2022-02-27 NOTE — Anesthesia Procedure Notes (Signed)
Epidural Patient location during procedure: OB Start time: 02/27/2022 9:40 PM End time: 02/27/2022 9:50 PM  Staffing Anesthesiologist: Lannie Fields, DO Performed: anesthesiologist   Preanesthetic Checklist Completed: patient identified, IV checked, risks and benefits discussed, monitors and equipment checked, pre-op evaluation and timeout performed  Epidural Patient position: sitting Prep: DuraPrep and site prepped and draped Patient monitoring: continuous pulse ox, blood pressure, heart rate and cardiac monitor Approach: midline Location: L3-L4 Injection technique: LOR air  Needle:  Needle type: Tuohy  Needle gauge: 17 G Needle length: 9 cm Needle insertion depth: 6 cm Catheter type: closed end flexible Catheter size: 19 Gauge Catheter at skin depth: 11 cm Test dose: negative  Assessment Sensory level: T8 Events: blood not aspirated, injection not painful, no injection resistance, no paresthesia and negative IV test  Additional Notes Patient identified. Risks/Benefits/Options discussed with patient including but not limited to bleeding, infection, nerve damage, paralysis, failed block, incomplete pain control, headache, blood pressure changes, nausea, vomiting, reactions to medication both or allergic, itching and postpartum back pain. Confirmed with bedside nurse the patient's most recent platelet count. Confirmed with patient that they are not currently taking any anticoagulation, have any bleeding history or any family history of bleeding disorders. Patient expressed understanding and wished to proceed. All questions were answered. Sterile technique was used throughout the entire procedure. Please see nursing notes for vital signs. Test dose was given through epidural catheter and negative prior to continuing to dose epidural or start infusion. Warning signs of high block given to the patient including shortness of breath, tingling/numbness in hands, complete motor  block, or any concerning symptoms with instructions to call for help. Patient was given instructions on fall risk and not to get out of bed. All questions and concerns addressed with instructions to call with any issues or inadequate analgesia.  Reason for block:procedure for pain

## 2022-02-28 ENCOUNTER — Encounter (HOSPITAL_COMMUNITY): Payer: Self-pay | Admitting: Obstetrics and Gynecology

## 2022-02-28 LAB — CBC
HCT: 35.5 % — ABNORMAL LOW (ref 36.0–46.0)
Hemoglobin: 11.9 g/dL — ABNORMAL LOW (ref 12.0–15.0)
MCH: 30.6 pg (ref 26.0–34.0)
MCHC: 33.5 g/dL (ref 30.0–36.0)
MCV: 91.3 fL (ref 80.0–100.0)
Platelets: 168 10*3/uL (ref 150–400)
RBC: 3.89 MIL/uL (ref 3.87–5.11)
RDW: 12.2 % (ref 11.5–15.5)
WBC: 19.2 10*3/uL — ABNORMAL HIGH (ref 4.0–10.5)
nRBC: 0 % (ref 0.0–0.2)

## 2022-02-28 MED ORDER — SIMETHICONE 80 MG PO CHEW: 80.0000 mg | CHEWABLE_TABLET | ORAL | Status: DC | PRN

## 2022-02-28 MED ORDER — ONDANSETRON HCL 4 MG/2ML IJ SOLN: 4.0000 mg | INTRAMUSCULAR | Status: DC | PRN

## 2022-02-28 MED ORDER — LACTATED RINGERS AMNIOINFUSION
INTRAVENOUS | Status: DC
Start: 2022-02-28 — End: 2022-02-28

## 2022-02-28 MED ORDER — COCONUT OIL OIL
1.0000 "application " | TOPICAL_OIL | Status: DC | PRN
  Administered 2022-02-28: 1 via TOPICAL

## 2022-02-28 MED ORDER — DIBUCAINE (PERIANAL) 1 % EX OINT: 1.0000 "application " | TOPICAL_OINTMENT | CUTANEOUS | Status: DC | PRN

## 2022-02-28 MED ORDER — OXYCODONE HCL 5 MG PO TABS: 10.0000 mg | ORAL_TABLET | ORAL | Status: DC | PRN

## 2022-02-28 MED ORDER — PRENATAL MULTIVITAMIN CH
1.0000 | ORAL_TABLET | Freq: Every day | ORAL | Status: DC
  Administered 2022-02-28 – 2022-03-01 (×2): 1 via ORAL
  Filled 2022-02-28 (×2): qty 1

## 2022-02-28 MED ORDER — ONDANSETRON HCL 4 MG PO TABS: 4.0000 mg | ORAL_TABLET | ORAL | Status: DC | PRN

## 2022-02-28 MED ORDER — ZOLPIDEM TARTRATE 5 MG PO TABS: 5.0000 mg | ORAL_TABLET | Freq: Every evening | ORAL | Status: DC | PRN

## 2022-02-28 MED ORDER — SENNOSIDES-DOCUSATE SODIUM 8.6-50 MG PO TABS
2.0000 | ORAL_TABLET | ORAL | Status: DC
  Administered 2022-02-28 – 2022-03-01 (×2): 2 via ORAL
  Filled 2022-02-28 (×2): qty 2

## 2022-02-28 MED ORDER — ACETAMINOPHEN 325 MG PO TABS: 650.0000 mg | ORAL_TABLET | ORAL | Status: DC | PRN

## 2022-02-28 MED ORDER — OXYCODONE HCL 5 MG PO TABS: 5.0000 mg | ORAL_TABLET | ORAL | Status: DC | PRN

## 2022-02-28 MED ORDER — TETANUS-DIPHTH-ACELL PERTUSSIS 5-2.5-18.5 LF-MCG/0.5 IM SUSY: 0.5000 mL | PREFILLED_SYRINGE | Freq: Once | INTRAMUSCULAR | Status: DC

## 2022-02-28 MED ORDER — WITCH HAZEL-GLYCERIN EX PADS: 1.0000 "application " | MEDICATED_PAD | CUTANEOUS | Status: DC | PRN

## 2022-02-28 MED ORDER — DIPHENHYDRAMINE HCL 25 MG PO CAPS: 25.0000 mg | ORAL_CAPSULE | Freq: Four times a day (QID) | ORAL | Status: DC | PRN

## 2022-02-28 MED ORDER — BENZOCAINE-MENTHOL 20-0.5 % EX AERO
1.0000 "application " | INHALATION_SPRAY | CUTANEOUS | Status: DC | PRN
  Administered 2022-02-28: 1 via TOPICAL
  Filled 2022-02-28: qty 56

## 2022-02-28 MED ORDER — IBUPROFEN 600 MG PO TABS
600.0000 mg | ORAL_TABLET | Freq: Four times a day (QID) | ORAL | Status: DC
  Administered 2022-02-28 – 2022-03-01 (×6): 600 mg via ORAL
  Filled 2022-02-28 (×6): qty 1

## 2022-02-28 NOTE — Lactation Note (Signed)
This note was copied from a baby's chart. Lactation Consultation Note  Patient Name: Tonya Copeland [id number] Date: 02/28/2022 Reason for consult: Follow-up assessment;Term Age:30 hours   Lactation Follow Up Consult:  Previous LC requested I follow up with a flange size assessment.  Mother had been #24 flanges.  Observed mother pumping and changed the #24 to a #27 on the right breast.  Continued to use the #24 on the left breast.  Mother stated that the #27 felt more comfortable and denied pain on either breast after this assessment.   Maternal Data    Feeding Mother's Current Feeding Choice: Breast Milk  LATCH Score                    Lactation Tools Discussed/Used Flange Size: 24;27 (#27 right side and #24 left side)  Interventions Interventions: Education  Discharge Pump: DEBP;Personal (DEBP Spectra)  Consult Status Consult Status: Follow-up Date: 03/01/22 Follow-up type: In-patient    Little Ishikawa 02/28/2022, 4:17 PM

## 2022-02-28 NOTE — Anesthesia Postprocedure Evaluation (Signed)
Anesthesia Post Note  Patient: Tonya Copeland  Procedure(s) Performed: AN AD HOC LABOR EPIDURAL     Patient location during evaluation: Mother Baby Anesthesia Type: Epidural Level of consciousness: awake and alert Pain management: pain level controlled Vital Signs Assessment: post-procedure vital signs reviewed and stable Respiratory status: spontaneous breathing, nonlabored ventilation and respiratory function stable Cardiovascular status: stable Postop Assessment: no headache, no backache, epidural receding, no apparent nausea or vomiting, patient able to bend at knees, adequate PO intake and able to ambulate Anesthetic complications: no   No notable events documented.  Last Vitals:  Vitals:   02/28/22 0442 02/28/22 0537  BP: 121/74 (!) 107/59  Pulse: 90 83  Resp: 17 16  Temp: 36.8 C 36.8 C  SpO2: 96% 95%    Last Pain:  Vitals:   02/28/22 0537  TempSrc: Oral  PainSc: 2    Pain Goal:                   Tonya Copeland

## 2022-02-28 NOTE — Progress Notes (Signed)
Post Partum Day 0 Subjective: no complaints  Objective: Blood pressure (!) 107/59, pulse 83, temperature 98.2 F (36.8 C), temperature source Oral, resp. rate 16, height 5\' 9"  (1.753 m), weight 98.6 kg, SpO2 95 %, unknown if currently breastfeeding.  Physical Exam:  General: alert, cooperative, and no distress Lochia: appropriate Uterine Fundus: firm Incision: healing well, vulva without hematoma DVT Evaluation: No evidence of DVT seen on physical exam.  Recent Labs    02/27/22 0250 02/28/22 0445  HGB 11.6* 11.9*  HCT 34.9* 35.5*    Assessment/Plan: Doing well   LOS: 1 day   03/02/22 II 02/28/2022, 7:23 AM

## 2022-02-28 NOTE — Progress Notes (Signed)
MOB was referred for history of depression/anxiety. * Referral screened out by Clinical Social Worker because none of the following criteria appear to apply: ~ History of anxiety/depression during this pregnancy, or of post-partum depression following prior delivery. ~ Diagnosis of anxiety and/or depression within last 3 years. No concerns noted in OB records. OR * MOB's symptoms currently being treated with medication and/or therapy.  Please contact the Clinical Social Worker if needs arise, by MOB request, or if MOB scores greater than 9/yes to question 10 on Edinburgh Postpartum Depression Screen.   Zakhari Fogel Boyd-Gilyard, MSW, LCSW Clinical Social Work [phone]  

## 2022-02-28 NOTE — Progress Notes (Signed)
Delivery Note At 2:38 AM a viable female was delivered via Vaginal, Spontaneous (Presentation: Right Occiput Anterior).  APGAR: 9, 9; weight  .   Placenta status: Spontaneous, Intact.  Cord: 3 vessels with the following complications: None.  Cord pH: 7.23  Rapid second stage with excellent effort pushing  Anesthesia: Epidural Episiotomy: None Lacerations:2nd degree across right labia minora and into 9:00 of introitus>repaired Small hematoma of left labia minora and 3:00 introitus>partially spontaneously drained>figure eight suture placed and compression. Observations and it appears stable Suture Repair: 2.0 vicryl rapide Est. Blood Loss (mL): 250  Mom to postpartum.  Baby to Couplet care / Skin to Skin.  Roselle Locus II 02/28/2022, 3:04 AM

## 2022-02-28 NOTE — Lactation Note (Signed)
This note was copied from a baby's chart. Lactation Consultation Note Baby suckling on thumb when Tennille arrived. Mom has edema to breast. Assisted in football position d/t best position for deeper latch at this time. LC used finger massage to soften areola to evert nipple. Colostrum noted. Praised mom. Latched baby after several attempts. Baby had wide open flange. Stopped a couple of times crying then started feeding well again. LC massage breast and compressed expressing colostrum as baby fed. Noted breast softening. Praised mom.  Mom will need to pre-pump prior to latching. LC will set up DEBP and leave shells for mom to wear in am. On MBU.  Patient Name: Tonya Copeland [id number] Date: 02/28/2022 Reason for consult: L&D Initial assessment;Primapara;Term Age:64 hours  Maternal Data Does the patient have breastfeeding experience prior to this delivery?: No  Feeding    LATCH Score Latch: Grasps breast easily, tongue down, lips flanged, rhythmical sucking.  Audible Swallowing: A few with stimulation  Type of Nipple: Flat  Comfort (Breast/Nipple): Filling, red/small blisters or bruises, mild/mod discomfort (edema)  Hold (Positioning): Assistance needed to correctly position infant at breast and maintain latch.  LATCH Score: 6   Lactation Tools Discussed/Used    Interventions Interventions: Adjust position;Breast feeding basics reviewed;Assisted with latch;Support pillows;Skin to skin;Position options;Breast massage;Breast compression  Discharge    Consult Status Consult Status: Follow-up from L&D Date: 02/28/22 Follow-up type: In-patient    Theodoro Kalata 02/28/2022, 3:32 AM

## 2022-02-28 NOTE — Lactation Note (Signed)
This note was copied from a baby's chart. Lactation Consultation Note LC set up DEBP in room and left shells at bedside table. LC put on #21 flanges.  LC will not be here when mom comes to room. LC reported to RN.  Patient Name: Tonya Copeland Date: 02/28/2022 Reason for consult: L&D Initial assessment;Primapara;Term Age:30 hours  Maternal Data Does the patient have breastfeeding experience prior to this delivery?: No  Feeding    LATCH Score Latch: Grasps breast easily, tongue down, lips flanged, rhythmical sucking.  Audible Swallowing: A few with stimulation  Type of Nipple: Flat  Comfort (Breast/Nipple): Filling, red/small blisters or bruises, mild/mod discomfort (edema)  Hold (Positioning): Assistance needed to correctly position infant at breast and maintain latch.  LATCH Score: 6   Lactation Tools Discussed/Used Tools: Pump;Shells;Flanges Flange Size: 21 Breast pump type: Double-Electric Breast Pump Reason for Pumping: flat  Interventions Interventions: DEBP;Shells  Discharge    Consult Status Consult Status: Follow-up from L&D Date: 02/28/22 Follow-up type: In-patient    Charyl Dancer 02/28/2022, 4:18 AM

## 2022-02-28 NOTE — Lactation Note (Signed)
This note was copied from a baby's chart. Lactation Consultation Note  Patient Name: Boy Terrion Poblano [id number] Date: 02/28/2022 Reason for consult: Follow-up assessment;Primapara;1st time breastfeeding;Term;Difficult latch;Other (Comment) (DL on the breast. per mom the baby recently fed, and it was pinching at 1st and then was comfortable. LC recommended for mom to call when she is free up from visitor to be shown how to use the DEBP for post pumping.) Age:30 hours  Maternal Data    Feeding Mother's Current Feeding Choice: Breast Milk  LATCH Score                    Lactation Tools Discussed/Used    Interventions    Discharge Pump: DEBP;Personal (DEBP Spectra)  Consult Status Consult Status: Follow-up Date: 02/28/22 Follow-up type: In-patient    Matilde Sprang Sanela Evola 02/28/2022, 2:07 PM

## 2022-02-28 NOTE — Lactation Note (Signed)
This note was copied from a baby's chart. Lactation Consultation Note  Patient Name: Tonya Copeland [id number] Date: 02/28/2022 Reason for consult: Primapara;1st time breastfeeding;Term;Initial assessment;Difficult latch;Other (Comment);Breastfeeding assistance (DL only on the left breast due to edema.) Age:30 hours.  Baby light sleeping when [place] entered and mom awake.  Baby started to stir, LC offered to to check and change diaper. Large full loose stool - mec to black. No wet. LC offered to assist and mom receptive. Baby opened wide and latched easily with depth. Per mom pinching at 1st and LC easy chin down and flipped upper lip to flange position. Per mom comfortable and continued . Baby still feeding at 15 mins with swallows / increased with warm moist compress. Latch 8 on the right.   LC assessed breast tissue prior to latch with moms permission and noted the left breast has areola edema / semi compressible. The Left breast is going to take time for latching. LC showed mom how to use the hand pump for pre pumping the right to stretch the nipple / areola complex.  DEBP has been set up . LC recommended for mom to order her breakfast and the post pumping can be started after the next feeding.  LC plan :  Feed with feeding cues, ( 8-12 times a day )  By 3 hours - STS .  Shells between feedings except when sleeping.  Offer the 1st breast after breast massage, hand express, prepump with the hand pump and reverse pressure, Latch with firm support.  Post pump both breast with a dab of coconut oil after feedings .   Maternal Data Has patient been taught Hand Expression?: Yes (per mom has been  shown)  Feeding Mother's Current Feeding Choice: Breast Milk  LATCH Score Latch: Grasps breast easily, tongue down, lips flanged, rhythmical sucking.  Audible Swallowing: Spontaneous and intermittent  Type of Nipple: Everted at rest and after stimulation  Comfort (Breast/Nipple): Filling, red/small  blisters or bruises, mild/mod discomfort (per mom pinching to start)  Hold (Positioning): Assistance needed to correctly position infant at breast and maintain latch.  LATCH Score: 8   Lactation Tools Discussed/Used Tools: Shells;Pump;Flanges Flange Size: 21 Breast pump type: Double-Electric Breast Pump;Manual Pump Education: Milk Storage;Setup, frequency, and cleaning  Interventions Interventions: Breast feeding basics reviewed;Assisted with latch;Skin to skin;Breast massage;Hand express;Reverse pressure;Breast compression;Adjust position;Support pillows;Position options;Shells;Coconut oil;Hand pump;DEBP;Education;LC Services brochure  Discharge Pump: DEBP;Manual WIC Program: No  Consult Status Consult Status: Follow-up Date: 02/28/22 Follow-up type: In-patient    [place] 02/28/2022, 8:38 AM

## 2022-03-01 MED ORDER — IBUPROFEN 600 MG PO TABS: 600.0000 mg | ORAL_TABLET | Freq: Four times a day (QID) | ORAL | 0 refills | Status: DC

## 2022-03-01 NOTE — Lactation Note (Signed)
This note was copied from a baby's chart. Lactation Consultation Note  Patient Name: Tonya Copeland Date: 03/01/2022 Reason for consult: Follow-up assessment Age:30 hours  P1, Baby has been sleepy after circumcision. Reviewed waking techniques.  Encouraged spoon feeding until he wakes. Feed on demand with cues.  Goal 8-12+ times per day after first 24 hrs.  Reviewed engorgement care and monitoring voids/stools. Reminded mother about support group.  Feeding Mother's Current Feeding Choice: Breast Milk  Interventions Interventions: Education  Discharge Discharge Education: Engorgement and breast care;Warning signs for feeding baby  Consult Status Consult Status: Complete Date: 03/01/22 Follow-up type: In-patient    Tonya Copeland [facility] 03/01/2022, 11:45 AM

## 2022-03-01 NOTE — Discharge Instructions (Signed)
Call MD for T>100.4, heavy vaginal bleeding, severe abdominal pain or respiratory distress.  Call office to schedule postpartum visit in 6 weeks.  Pelvic rest x 6 weeks.   

## 2022-03-01 NOTE — Progress Notes (Signed)
Mom states infant had Vitamin K shot even though it was not charted.

## 2022-03-01 NOTE — Progress Notes (Signed)
Post Partum Day 1 Subjective: no complaints, up ad lib, voiding, and tolerating PO.  Desires d/c home today and circ prior to d/c.    Objective: Blood pressure 113/73, pulse 71, temperature 98.6 F (37 C), temperature source Oral, resp. rate 16, height 5\' 9"  (1.753 m), weight 98.6 kg, SpO2 98 %, unknown if currently breastfeeding.  Physical Exam:  General: alert, cooperative, and appears stated age Lochia: appropriate Uterine Fundus: firm Incision: healing well, no significant drainage DVT Evaluation: No evidence of DVT seen on physical exam. Negative Homan's sign. No cords or calf tenderness.  Recent Labs    02/27/22 0250 02/28/22 0445  HGB 11.6* 11.9*  HCT 34.9* 35.5*    Assessment/Plan: Discharge home, Breastfeeding, and Circumcision prior to discharge Circ-patient is counseled re: risk of bleeding, infection and scarring.  All questions were answered and will proceed.   LOS: 2 days   03/02/22 03/01/2022, 8:04 AM

## 2022-03-01 NOTE — Discharge Summary (Signed)
Postpartum Discharge Summary    Patient Name: Tonya Copeland DOB: Jun 07, 1991 MRN: [id number]  Date of admission: 02/27/2022 Delivery date:02/28/2022  Delivering provider: Everlene Farrier  Date of discharge: 03/01/2022  Admitting diagnosis: Term pregnancy [Z34.90] Intrauterine pregnancy: [redacted]w[redacted]d    Secondary diagnosis:  Principal Problem:   Term pregnancy  Additional problems: none    Discharge diagnosis: Term Pregnancy Delivered                                              Post partum procedures: none Augmentation: Cytotec Complications: None  Hospital course: Induction of Labor With Vaginal Delivery   31y.o. yo G1P1001 at 477w0das admitted to the hospital 02/27/2022 for induction of labor.  Indication for induction: Elective.  Patient had an uncomplicated labor course as follows: Membrane Rupture Time/Date: 8:35 PM ,02/27/2022   Delivery Method:Vaginal, Spontaneous  Episiotomy: None  Lacerations:  2nd degree;Labial  Details of delivery can be found in separate delivery note.  Patient had a routine postpartum course. Patient is discharged home 03/01/22.  Newborn Data: Birth date:02/28/2022  Birth time:2:38 AM  Gender:Female  Living status:Living  Apgars:9 ,9  Weight:3670 g   Magnesium Sulfate received: No BMZ received: No Rhophylac:No MMR:No T-DaP:Given prenatally Flu: No Transfusion:No  Physical exam  Vitals:   02/28/22 1259 02/28/22 1710 02/28/22 2034 03/01/22 0622  BP: 113/75 107/62 (!) 109/59 113/73  Pulse: 74 75 76 71  Resp: _0 Temp: 97.8 F (36.6 C) 97.7 F (36.5 C) 99 F (37.2 C) 98.6 F (37 C)  TempSrc: Oral Oral Oral Oral  SpO2: 96% 98%  98%  Weight:      Height:       General: alert, cooperative, and no distress Lochia: appropriate Uterine Fundus: firm Incision: Healing well with no significant drainage DVT Evaluation: No evidence of DVT seen on physical exam. Negative Homan's sign. No cords or calf tenderness. Labs: Lab Results   Component Value Date   WBC 19.2 (H) 02/28/2022   HGB 11.9 (L) 02/28/2022   HCT 35.5 (L) 02/28/2022   MCV 91.3 02/28/2022   PLT 168 02/28/2022      Latest Ref Rng & Units 08/04/2021    1:03 PM  CMP  Glucose 70 - 99 mg/dL 69    BUN 6 - 20 mg/dL 6    Creatinine 0.57 - 1.00 mg/dL 0.52    Sodium 134 - 144 mmol/L 137    Potassium 3.5 - 5.2 mmol/L 4.3    Chloride 96 - 106 mmol/L 102    CO2 20 - 29 mmol/L 21    Calcium 8.7 - 10.2 mg/dL 9.8     Edinburgh Score:     View : No data to display.           After visit meds:  Allergies as of 03/01/2022   No Known Allergies      Medication List     STOP taking these medications    benzonatate 200 MG capsule Commonly known as: TESSALON   cephALEXin 500 MG capsule Commonly known as: KEFLEX   guaiFENesin-codeine 100-10 MG/5ML syrup Commonly known as: ROBITUSSIN AC   levonorgestrel-ethinyl estradiol 0.1-20 MG-MCG tablet Commonly known as: Aviane   metroNIDAZOLE 500 MG tablet Commonly known as: FLAGYL   mupirocin ointment 2 % Commonly known as: BACTROBAN   ondansetron 4  MG tablet Commonly known as: Zofran   oxyCODONE-acetaminophen 5-325 MG tablet Commonly known as: PERCOCET/ROXICET   Santyl 250 UNIT/GM ointment Generic drug: collagenase   silver sulfADIAZINE 1 % cream Commonly known as: Silvadene   Sronyx 0.1-20 MG-MCG tablet Generic drug: levonorgestrel-ethinyl estradiol       TAKE these medications    citalopram 20 MG tablet Commonly known as: CELEXA START WITH 1/2 A TABLET BY MOUTH DAILY FOR 1 WEEK, THEN INCREASE TO 1 TAB DAILY   ibuprofen 600 MG tablet Commonly known as: ADVIL Take 1 tablet (600 mg total) by mouth every 6 (six) hours.   PreNatal DHA 200 MG Caps Generic drug: Docosahexaenoic Acid Prenatal + DHA         Discharge home in stable condition Infant Feeding: Breast Infant Disposition:home with mother Discharge instruction: per After Visit Summary and Postpartum  booklet. Activity: Advance as tolerated. Pelvic rest for 6 weeks.  Diet: routine diet Future Appointments:No future appointments. Follow up Visit: 6 weeks PPV   03/01/2022 Linda Hedges, DO

## 2022-03-02 LAB — SURGICAL PATHOLOGY

## 2022-03-09 ENCOUNTER — Telehealth (HOSPITAL_COMMUNITY): Payer: Self-pay | Admitting: *Deleted

## 2022-03-09 NOTE — Telephone Encounter (Signed)
Attempted hospital discharge follow-up call. Left message for patient to return RN call. Deforest Hoyles, RN, 03/09/22, [phone]

## 2023-12-04 ENCOUNTER — Emergency Department (HOSPITAL_BASED_OUTPATIENT_CLINIC_OR_DEPARTMENT_OTHER)
Admission: EM | Admit: 2023-12-04 | Discharge: 2023-12-04 | Disposition: A | Discharge status: Home / Self Care | Payer: Self-pay | Attending: Emergency Medicine | Admitting: Emergency Medicine

## 2023-12-04 ENCOUNTER — Emergency Department (HOSPITAL_BASED_OUTPATIENT_CLINIC_OR_DEPARTMENT_OTHER): Payer: Self-pay | Admitting: Radiology

## 2023-12-04 ENCOUNTER — Encounter (HOSPITAL_BASED_OUTPATIENT_CLINIC_OR_DEPARTMENT_OTHER): Payer: Self-pay

## 2023-12-04 DIAGNOSIS — J101 Influenza due to other identified influenza virus with other respiratory manifestations: Secondary | ICD-10-CM | POA: Insufficient documentation

## 2023-12-04 DIAGNOSIS — J111 Influenza due to unidentified influenza virus with other respiratory manifestations: Secondary | ICD-10-CM

## 2023-12-04 DIAGNOSIS — R1012 Left upper quadrant pain: Secondary | ICD-10-CM | POA: Insufficient documentation

## 2023-12-04 DIAGNOSIS — D72829 Elevated white blood cell count, unspecified: Secondary | ICD-10-CM | POA: Insufficient documentation

## 2023-12-04 DIAGNOSIS — J189 Pneumonia, unspecified organism: Secondary | ICD-10-CM

## 2023-12-04 DIAGNOSIS — J181 Lobar pneumonia, unspecified organism: Secondary | ICD-10-CM | POA: Insufficient documentation

## 2023-12-04 LAB — CBC
HCT: 41 % (ref 36.0–46.0)
Hemoglobin: 13.5 g/dL (ref 12.0–15.0)
MCH: 29.5 pg (ref 26.0–34.0)
MCHC: 32.9 g/dL (ref 30.0–36.0)
MCV: 89.7 fL (ref 80.0–100.0)
Platelets: 282 10*3/uL (ref 150–400)
RBC: 4.57 MIL/uL (ref 3.87–5.11)
RDW: 12.4 % (ref 11.5–15.5)
WBC: 16 10*3/uL — ABNORMAL HIGH (ref 4.0–10.5)
nRBC: 0 % (ref 0.0–0.2)

## 2023-12-04 LAB — COMPREHENSIVE METABOLIC PANEL
ALT: 9 U/L (ref 0–44)
AST: 12 U/L — ABNORMAL LOW (ref 15–41)
Albumin: 4.4 g/dL (ref 3.5–5.0)
Alkaline Phosphatase: 55 U/L (ref 38–126)
Anion gap: 12 (ref 5–15)
BUN: 7 mg/dL (ref 6–20)
CO2: 21 mmol/L — ABNORMAL LOW (ref 22–32)
Calcium: 9.6 mg/dL (ref 8.9–10.3)
Chloride: 102 mmol/L (ref 98–111)
Creatinine, Ser: 0.64 mg/dL (ref 0.44–1.00)
GFR, Estimated: >60 mL/min (ref >60)
Glucose, Bld: 92 mg/dL (ref 70–99)
Potassium: 3.7 mmol/L (ref 3.5–5.1)
Sodium: 135 mmol/L (ref 135–145)
Total Bilirubin: 0.5 mg/dL (ref 0.0–1.2)
Total Protein: 8.7 g/dL — ABNORMAL HIGH (ref 6.5–8.1)

## 2023-12-04 LAB — URINALYSIS, ROUTINE W REFLEX MICROSCOPIC
Bilirubin Urine: NEGATIVE
Glucose, UA: NEGATIVE mg/dL
Hgb urine dipstick: NEGATIVE
Ketones, ur: 40 mg/dL — AB
Leukocytes,Ua: NEGATIVE
Nitrite: NEGATIVE
Specific Gravity, Urine: 1.032 — ABNORMAL HIGH (ref 1.005–1.030)
pH: 6 (ref 5.0–8.0)

## 2023-12-04 LAB — RESP PANEL BY RT-PCR (RSV, FLU A&B, COVID)  RVPGX2
Influenza A by PCR: POSITIVE — AB
Influenza B by PCR: NEGATIVE
Resp Syncytial Virus by PCR: NEGATIVE
SARS Coronavirus 2 by RT PCR: NEGATIVE

## 2023-12-04 LAB — PREGNANCY, URINE: Preg Test, Ur: NEGATIVE

## 2023-12-04 LAB — LIPASE, BLOOD: Lipase: 12 U/L (ref 11–51)

## 2023-12-04 MED ORDER — AMOXICILLIN-POT CLAVULANATE 875-125 MG PO TABS: 1.0000 | ORAL_TABLET | Freq: Two times a day (BID) | ORAL | 0 refills | Status: AC

## 2023-12-04 MED ORDER — AMOXICILLIN-POT CLAVULANATE 875-125 MG PO TABS
1.0000 | ORAL_TABLET | Freq: Once | ORAL | Status: AC
  Administered 2023-12-04: 1 via ORAL
  Filled 2023-12-04: qty 1

## 2023-12-04 MED ORDER — ACETAMINOPHEN 500 MG PO TABS
1000.0000 mg | ORAL_TABLET | Freq: Four times a day (QID) | ORAL | Status: DC | PRN
  Administered 2023-12-04: 1000 mg via ORAL
  Filled 2023-12-04: qty 2

## 2023-12-04 MED ORDER — BENZONATATE 100 MG PO CAPS: 100.0000 mg | ORAL_CAPSULE | Freq: Three times a day (TID) | ORAL | 0 refills | Status: AC

## 2023-12-04 MED ORDER — ALBUTEROL SULFATE HFA 108 (90 BASE) MCG/ACT IN AERS
1.0000 | INHALATION_SPRAY | Freq: Once | RESPIRATORY_TRACT | Status: AC
  Administered 2023-12-04: 1 via RESPIRATORY_TRACT
  Filled 2023-12-04: qty 6.7

## 2023-12-04 NOTE — ED Provider Notes (Signed)
 [place] EMERGENCY DEPARTMENT AT [facility] Provider Note   CSN: [id number] Arrival date & time: 12/04/23  1751     History  Chief Complaint  Patient presents with   Abdominal Pain    LUQ    Tonya Copeland is a 33 y.o. female patient with past medical history of HPV, prior pregnancy presents to emergency room with complaint of 1 day of left lower chest wall pain.  She reports that her pain is worse when she takes a deep breath then however she feels the discomfort is constant.  Patient reports she has been getting over the flu for approximately 1 week and has associated congestion but with her change in symptoms she wanted to come here to get checked out.  She reports sometimes her cough is dry and sometimes her cough is productive.  Denies any shortness of breath or chest pain.   Abdominal Pain Associated symptoms: cough        Home Medications Prior to Admission medications   Medication Sig Start Date End Date Taking? Authorizing Provider  citalopram (CELEXA) 20 MG tablet START WITH 1/2 A TABLET BY MOUTH DAILY FOR 1 WEEK, THEN INCREASE TO 1 TAB DAILY 07/27/21   Bacigalupo, Marzella Schlein, MD  Docosahexaenoic Acid (PRENATAL DHA) 200 MG CAPS Prenatal + DHA    [provider]  ibuprofen (ADVIL) 600 MG tablet Take 1 tablet (600 mg total) by mouth every 6 (six) hours. 03/01/22   Mitchel Honour, DO      Allergies    Patient has no known allergies.    Review of Systems   Review of Systems  Respiratory:  Positive for cough.   Gastrointestinal:  Negative for abdominal pain.    Physical Exam Updated Vital Signs BP 137/80   Pulse (!) 101   Temp 97.9 F (36.6 C)   Resp 20   LMP 12/02/2023 (Exact Date)   SpO2 100%  Physical Exam Vitals and nursing note reviewed.  Constitutional:      General: She is not in acute distress.    Appearance: She is not toxic-appearing.  HENT:     Head: Normocephalic and atraumatic.  Eyes:     General: No scleral icterus.     Conjunctiva/sclera: Conjunctivae normal.  Cardiovascular:     Rate and Rhythm: Normal rate and regular rhythm.     Pulses: Normal pulses.     Heart sounds: Normal heart sounds.  Pulmonary:     Effort: Pulmonary effort is normal. No respiratory distress.     Breath sounds: Rhonchi present. No wheezing.     Comments: Left lower chest rhonchi  Chest:     Chest wall: Tenderness present.  Abdominal:     General: Abdomen is flat. Bowel sounds are normal.     Palpations: Abdomen is soft.     Tenderness: There is no abdominal tenderness.  Musculoskeletal:     Right lower leg: No edema.     Left lower leg: No edema.  Skin:    General: Skin is warm and dry.     Findings: No lesion.  Neurological:     General: No focal deficit present.     Mental Status: She is alert and oriented to person, place, and time. Mental status is at baseline.     ED Results / Procedures / Treatments   Labs (all labs ordered are listed, but only abnormal results are displayed) Labs Reviewed  RESP PANEL BY RT-PCR (RSV, FLU A&B, COVID)  RVPGX2 -  Abnormal; Notable for the following components:      Result Value   Influenza A by PCR POSITIVE (*)    All other components within normal limits  COMPREHENSIVE METABOLIC PANEL - Abnormal; Notable for the following components:   CO2 21 (*)    Total Protein 8.7 (*)    AST 12 (*)    All other components within normal limits  CBC - Abnormal; Notable for the following components:   WBC 16.0 (*)    All other components within normal limits  URINALYSIS, ROUTINE W REFLEX MICROSCOPIC - Abnormal; Notable for the following components:   Specific Gravity, Urine 1.032 (*)    Ketones, ur 40 (*)    Protein, ur TRACE (*)    All other components within normal limits  LIPASE, BLOOD  PREGNANCY, URINE    EKG None  Radiology DG Chest 2 View Result Date: 12/04/2023 CLINICAL DATA:  Chest pain, left upper abdominal pain EXAM: CHEST - 2 VIEW COMPARISON:  03/13/2013 FINDINGS:  Heart and mediastinal contours within normal limits. Consolidation in the left lung base compatible with pneumonia. No confluent opacity on the right. No effusions. No acute bony abnormality. IMPRESSION: Lingular pneumonia at the left lung base. Electronically Signed   By: Charlett Nose M.D.   On: 12/04/2023 20:14    Procedures Procedures    Medications Ordered in ED Medications - No data to display  ED Course/ Medical Decision Making/ A&P                                 Medical Decision Making Amount and/or Complexity of Data Reviewed Labs: ordered. Radiology: ordered.  Risk OTC drugs. Prescription drug management.   Tonya Copeland 33 y.o. presented today for URI like symptoms. Working DDx that I considered at this time includes, but not limited to, viral illness, pharyngitis, mono, sinusitis, electrolyte abnormality, AOM.  R/o DDx: these additional diagnoses are not consistent with patient's history, presentation, physical exam, labs/imaging findings.  Review of prior external notes: None  Labs:  Respiratory Panel: Influenza A CBC with leukocytosis of 16 and no anemia.  CMP is unremarkable.  Lipase is 12.  Urine unremarkable.  Negative pregnancy test  Imaging: No inguinal pneumonia on chest x-ray   Problem List / ED Course / Critical interventions / Medication management  Reporting to emergency room with 1 week of flulike symptoms.  She did test positive for influenza A.  Given duration of symptoms and productive cough did obtain chest x-ray which shows left-sided lower lobe pneumonia.  She does not have any comorbidities.  She is hemodynamically stable and not hypoxic.  Symptoms are well-controlled here for the first dose of antibiotics and have her follow-up with primary care.  Podiatry is no she reports that she presented with left upper quadrant pain she is not having any abdominal pain on exam.  Abdomen soft nondistended nontender tender.  Thus do not feel imaging is  necessary at this time. I ordered medication including Tylenol, first dose on antibiotics given here. Will send home with albuterol, but she is not  Reevaluation of the patient after these medicines showed that the patient improved Patients vitals assessed. Upon arrival patient is hemodynamically stable.  I have reviewed the patients home medicines and have made adjustments as needed     Plan:  F/u w/ PCP in 2-3d to ensure resolution of sx.  Patient was given return precautions. Patient stable  for discharge at this time.  Patient educated on sx and dx and verbalized understanding of plan. Return to ER if new or worsening sx.          Final Clinical Impression(s) / ED Diagnoses Final diagnoses:  Influenza  Pneumonia of left lower lobe due to infectious organism    Rx / DC Orders ED Discharge Orders     None         Smitty Knudsen, PA-C 12/04/23 2340    Estelle June A, DO 12/05/23 1725

## 2023-12-04 NOTE — ED Triage Notes (Signed)
 Pt c/o LUQ/ epigastric pain onset yesterday morning, "wraps around to back." Advises that pain is worse w cough & deep inspiration, but constant. Advises URI symptoms x1wk, advises "it's getting better I think."

## 2023-12-04 NOTE — Discharge Instructions (Addendum)
 You were seen in the emergency room today and found have pneumonia.  I have sent Augmentin to your pharmacy please take as prescribed.  Sure you finish the full course of antibiotics.  I have also sent Tessalon Perles to your pharmacy which you can take as needed for cough.  Tessalon Perles are not safe for children to use.  For fevers and muscle aches I recommend alternating Tylenol and ibuprofen.  Take 1000 mg of Tylenol every 6 hours.  You can also alternate with the ibuprofen.  I sent you home with albuterol inhaler to use as needed for shortness of breath and wheezing.  Please return to emergency room if you have not start to have improvement of symptoms in the next 2 to 3 days.
